# Patient Record
Sex: Female | Born: 1935 | Race: White | Hispanic: No | Marital: Married | Smoking: Never smoker
Health system: Southern US, Community
[De-identification: ages and names within clinical notes are randomized; demographics above are authoritative.]

## PROBLEM LIST (undated history)

## (undated) DIAGNOSIS — B029 Zoster without complications: Secondary | ICD-10-CM

## (undated) DIAGNOSIS — D469 Myelodysplastic syndrome, unspecified: Secondary | ICD-10-CM

## (undated) HISTORY — DX: Zoster without complications: B02.9

## (undated) HISTORY — DX: Myelodysplastic syndrome, unspecified: D46.9

---

## 1999-08-24 ENCOUNTER — Encounter: Payer: Self-pay | Admitting: Geriatric Medicine

## 1999-08-24 ENCOUNTER — Encounter: Admission: RE | Admit: 1999-08-24 | Discharge: 1999-08-24 | Payer: Self-pay | Admitting: Geriatric Medicine

## 2000-07-08 ENCOUNTER — Emergency Department (HOSPITAL_COMMUNITY): Admission: EM | Admit: 2000-07-08 | Discharge: 2000-07-08 | Payer: Self-pay | Admitting: Emergency Medicine

## 2000-10-29 ENCOUNTER — Encounter: Payer: Self-pay | Admitting: Geriatric Medicine

## 2000-10-29 ENCOUNTER — Ambulatory Visit (HOSPITAL_COMMUNITY): Admission: RE | Admit: 2000-10-29 | Discharge: 2000-10-29 | Payer: Self-pay | Admitting: Geriatric Medicine

## 2001-11-22 ENCOUNTER — Ambulatory Visit (HOSPITAL_COMMUNITY): Admission: RE | Admit: 2001-11-22 | Discharge: 2001-11-22 | Payer: Self-pay | Admitting: Geriatric Medicine

## 2001-11-22 ENCOUNTER — Encounter: Payer: Self-pay | Admitting: Geriatric Medicine

## 2002-12-26 ENCOUNTER — Encounter: Payer: Self-pay | Admitting: Geriatric Medicine

## 2002-12-26 ENCOUNTER — Ambulatory Visit (HOSPITAL_COMMUNITY): Admission: RE | Admit: 2002-12-26 | Discharge: 2002-12-26 | Payer: Self-pay | Admitting: Geriatric Medicine

## 2004-01-15 ENCOUNTER — Ambulatory Visit (HOSPITAL_COMMUNITY): Admission: RE | Admit: 2004-01-15 | Discharge: 2004-01-15 | Payer: Self-pay | Admitting: Geriatric Medicine

## 2005-02-16 ENCOUNTER — Ambulatory Visit (HOSPITAL_COMMUNITY): Admission: RE | Admit: 2005-02-16 | Discharge: 2005-02-16 | Payer: Self-pay | Admitting: Geriatric Medicine

## 2006-02-19 ENCOUNTER — Ambulatory Visit (HOSPITAL_COMMUNITY): Admission: RE | Admit: 2006-02-19 | Discharge: 2006-02-19 | Payer: Self-pay | Admitting: Geriatric Medicine

## 2007-03-06 ENCOUNTER — Ambulatory Visit (HOSPITAL_COMMUNITY): Admission: RE | Admit: 2007-03-06 | Discharge: 2007-03-06 | Payer: Self-pay | Admitting: Geriatric Medicine

## 2008-03-25 ENCOUNTER — Ambulatory Visit (HOSPITAL_COMMUNITY): Admission: RE | Admit: 2008-03-25 | Discharge: 2008-03-25 | Payer: Self-pay | Admitting: Geriatric Medicine

## 2009-03-31 ENCOUNTER — Ambulatory Visit (HOSPITAL_COMMUNITY): Admission: RE | Admit: 2009-03-31 | Discharge: 2009-03-31 | Payer: Self-pay | Admitting: Geriatric Medicine

## 2010-04-01 ENCOUNTER — Ambulatory Visit (HOSPITAL_COMMUNITY)
Admission: RE | Admit: 2010-04-01 | Discharge: 2010-04-01 | Payer: Self-pay | Source: Home / Self Care | Attending: Geriatric Medicine | Admitting: Geriatric Medicine

## 2010-05-05 ENCOUNTER — Ambulatory Visit: Payer: Self-pay | Admitting: Oncology

## 2010-07-12 ENCOUNTER — Other Ambulatory Visit: Payer: Self-pay | Admitting: Oncology

## 2010-07-12 ENCOUNTER — Encounter (HOSPITAL_BASED_OUTPATIENT_CLINIC_OR_DEPARTMENT_OTHER): Payer: Medicare Other | Admitting: Oncology

## 2010-07-12 DIAGNOSIS — C8299 Follicular lymphoma, unspecified, extranodal and solid organ sites: Secondary | ICD-10-CM

## 2010-07-12 LAB — CBC & DIFF AND RETIC
Basophils Absolute: 0 10*3/uL (ref 0.0–0.1)
EOS%: 0.4 % (ref 0.0–7.0)
HGB: 9.9 g/dL — ABNORMAL LOW (ref 11.6–15.9)
LYMPH%: 41.1 % (ref 14.0–49.7)
MCH: 33.4 pg (ref 25.1–34.0)
NEUT#: 1.2 10*3/uL — ABNORMAL LOW (ref 1.5–6.5)
NEUT%: 44.4 % (ref 38.4–76.8)
Platelets: 17 10*3/uL — ABNORMAL LOW (ref 145–400)
Retic Ct Abs: 17.46 10*3/uL — ABNORMAL LOW (ref 18.30–72.70)
lymph#: 1.1 10*3/uL (ref 0.9–3.3)

## 2010-07-12 LAB — COMPREHENSIVE METABOLIC PANEL
AST: 23 U/L (ref 0–37)
Albumin: 4.1 g/dL (ref 3.5–5.2)
BUN: 25 mg/dL — ABNORMAL HIGH (ref 6–23)
CO2: 23 mEq/L (ref 19–32)
Calcium: 9 mg/dL (ref 8.4–10.5)
Creatinine, Ser: 0.85 mg/dL (ref 0.40–1.20)
Glucose, Bld: 84 mg/dL (ref 70–99)
Potassium: 4.3 mEq/L (ref 3.5–5.3)
Sodium: 140 mEq/L (ref 135–145)
Total Bilirubin: 0.6 mg/dL (ref 0.3–1.2)
Total Protein: 6.7 g/dL (ref 6.0–8.3)

## 2010-07-12 LAB — MORPHOLOGY: PLT EST: DECREASED

## 2010-07-12 LAB — LACTATE DEHYDROGENASE: LDH: 200 U/L (ref 94–250)

## 2010-07-27 ENCOUNTER — Encounter (HOSPITAL_BASED_OUTPATIENT_CLINIC_OR_DEPARTMENT_OTHER): Payer: Medicare Other | Admitting: Oncology

## 2010-07-27 ENCOUNTER — Other Ambulatory Visit: Payer: Self-pay | Admitting: Oncology

## 2010-07-27 ENCOUNTER — Encounter: Payer: Self-pay | Admitting: Oncology

## 2010-07-27 DIAGNOSIS — D469 Myelodysplastic syndrome, unspecified: Secondary | ICD-10-CM

## 2010-07-27 DIAGNOSIS — C8299 Follicular lymphoma, unspecified, extranodal and solid organ sites: Secondary | ICD-10-CM

## 2010-07-27 LAB — COMPREHENSIVE METABOLIC PANEL
ALT: 14 U/L (ref 0–35)
Alkaline Phosphatase: 56 U/L (ref 39–117)
BUN: 20 mg/dL (ref 6–23)
Chloride: 106 mEq/L (ref 96–112)
Potassium: 4.2 mEq/L (ref 3.5–5.3)
Sodium: 139 mEq/L (ref 135–145)
Total Bilirubin: 0.6 mg/dL (ref 0.3–1.2)

## 2010-07-27 LAB — CBC WITH DIFFERENTIAL/PLATELET
BASO%: 0.1 % (ref 0.0–2.0)
LYMPH%: 33.5 % (ref 14.0–49.7)
MCH: 36 pg — ABNORMAL HIGH (ref 25.1–34.0)
MCV: 103.2 fL — ABNORMAL HIGH (ref 79.5–101.0)
NEUT%: 49.4 % (ref 38.4–76.8)
Platelets: 13 10*3/uL — ABNORMAL LOW (ref 145–400)
RBC: 2.18 10*6/uL — ABNORMAL LOW (ref 3.70–5.45)
WBC: 2.1 10*3/uL — ABNORMAL LOW (ref 3.9–10.3)

## 2010-08-10 ENCOUNTER — Other Ambulatory Visit: Payer: Self-pay | Admitting: Oncology

## 2010-08-10 ENCOUNTER — Encounter (HOSPITAL_BASED_OUTPATIENT_CLINIC_OR_DEPARTMENT_OTHER): Payer: Medicare Other | Admitting: Oncology

## 2010-08-10 DIAGNOSIS — C8299 Follicular lymphoma, unspecified, extranodal and solid organ sites: Secondary | ICD-10-CM

## 2010-08-10 DIAGNOSIS — D469 Myelodysplastic syndrome, unspecified: Secondary | ICD-10-CM

## 2010-08-10 LAB — CBC WITH DIFFERENTIAL/PLATELET
EOS%: 1.3 % (ref 0.0–7.0)
LYMPH%: 35.1 % (ref 14.0–49.7)
MCH: 34.9 pg — ABNORMAL HIGH (ref 25.1–34.0)
MCHC: 32.8 g/dL (ref 31.5–36.0)
MONO%: 16.4 % — ABNORMAL HIGH (ref 0.0–14.0)
NEUT#: 1.4 10*3/uL — ABNORMAL LOW (ref 1.5–6.5)
Platelets: 14 10*3/uL — ABNORMAL LOW (ref 145–400)
RBC: 2.18 10*6/uL — ABNORMAL LOW (ref 3.70–5.45)
WBC: 3 10*3/uL — ABNORMAL LOW (ref 3.9–10.3)
nRBC: 0 % (ref 0–0)

## 2010-08-24 ENCOUNTER — Other Ambulatory Visit: Payer: Self-pay | Admitting: Oncology

## 2010-08-24 ENCOUNTER — Encounter (HOSPITAL_COMMUNITY)
Admission: RE | Admit: 2010-08-24 | Discharge: 2010-08-24 | Disposition: A | Discharge status: Home / Self Care | Payer: Medicare Other | Source: Ambulatory Visit | Attending: Oncology | Admitting: Oncology

## 2010-08-24 ENCOUNTER — Encounter (HOSPITAL_BASED_OUTPATIENT_CLINIC_OR_DEPARTMENT_OTHER): Payer: Medicare Other | Admitting: Oncology

## 2010-08-24 DIAGNOSIS — D469 Myelodysplastic syndrome, unspecified: Secondary | ICD-10-CM

## 2010-08-24 DIAGNOSIS — C8299 Follicular lymphoma, unspecified, extranodal and solid organ sites: Secondary | ICD-10-CM

## 2010-08-24 DIAGNOSIS — D649 Anemia, unspecified: Secondary | ICD-10-CM | POA: Insufficient documentation

## 2010-08-24 LAB — CBC WITH DIFFERENTIAL/PLATELET
BASO%: 1.8 % (ref 0.0–2.0)
EOS%: 1.8 % (ref 0.0–7.0)
Eosinophils Absolute: 0 10*3/uL (ref 0.0–0.5)
HGB: 6.5 g/dL — CL (ref 11.6–15.9)
LYMPH%: 46.6 % (ref 14.0–49.7)
MCH: 40.1 pg — ABNORMAL HIGH (ref 25.1–34.0)
MCV: 112.6 fL — ABNORMAL HIGH (ref 79.5–101.0)
NEUT#: 0.6 10*3/uL — ABNORMAL LOW (ref 1.5–6.5)
NEUT%: 29.8 % — ABNORMAL LOW (ref 38.4–76.8)
RDW: 29 % — ABNORMAL HIGH (ref 11.2–14.5)
lymph#: 0.9 10*3/uL (ref 0.9–3.3)

## 2010-08-25 LAB — CROSSMATCH: ABO/RH(D): B POS

## 2010-08-29 ENCOUNTER — Encounter (HOSPITAL_COMMUNITY): Payer: Medicare Other

## 2010-09-02 ENCOUNTER — Encounter (HOSPITAL_COMMUNITY): Payer: Medicare Other

## 2010-09-06 ENCOUNTER — Encounter (HOSPITAL_COMMUNITY): Payer: Medicare Other

## 2010-09-07 ENCOUNTER — Other Ambulatory Visit: Payer: Self-pay | Admitting: Oncology

## 2010-09-07 ENCOUNTER — Encounter (HOSPITAL_BASED_OUTPATIENT_CLINIC_OR_DEPARTMENT_OTHER): Payer: Medicare Other | Admitting: Oncology

## 2010-09-07 DIAGNOSIS — D469 Myelodysplastic syndrome, unspecified: Secondary | ICD-10-CM

## 2010-09-07 LAB — CBC WITH DIFFERENTIAL/PLATELET
EOS%: 1.1 % (ref 0.0–7.0)
Eosinophils Absolute: 0 10*3/uL (ref 0.0–0.5)
NEUT%: 39.2 % (ref 38.4–76.8)
Platelets: 13 10*3/uL — ABNORMAL LOW (ref 145–400)
lymph#: 1.2 10*3/uL (ref 0.9–3.3)
nRBC: 0 % (ref 0–0)

## 2010-09-21 ENCOUNTER — Encounter (HOSPITAL_BASED_OUTPATIENT_CLINIC_OR_DEPARTMENT_OTHER): Payer: Medicare Other | Admitting: Oncology

## 2010-09-21 ENCOUNTER — Other Ambulatory Visit: Payer: Self-pay | Admitting: Oncology

## 2010-09-21 DIAGNOSIS — C8299 Follicular lymphoma, unspecified, extranodal and solid organ sites: Secondary | ICD-10-CM

## 2010-09-21 DIAGNOSIS — D469 Myelodysplastic syndrome, unspecified: Secondary | ICD-10-CM

## 2010-09-21 LAB — CBC WITH DIFFERENTIAL/PLATELET
BASO%: 0 % (ref 0.0–2.0)
Basophils Absolute: 0 10*3/uL (ref 0.0–0.1)
EOS%: 0.3 % (ref 0.0–7.0)
HCT: 24 % — ABNORMAL LOW (ref 34.8–46.6)
HGB: 8 g/dL — ABNORMAL LOW (ref 11.6–15.9)
MCH: 36.2 pg — ABNORMAL HIGH (ref 25.1–34.0)
MCHC: 33.3 g/dL (ref 31.5–36.0)
MCV: 108.6 fL — ABNORMAL HIGH (ref 79.5–101.0)
NEUT#: 2.2 10*3/uL (ref 1.5–6.5)
Platelets: 17 10*3/uL — ABNORMAL LOW (ref 145–400)
WBC: 3.6 10*3/uL — ABNORMAL LOW (ref 3.9–10.3)

## 2010-10-05 ENCOUNTER — Encounter (HOSPITAL_BASED_OUTPATIENT_CLINIC_OR_DEPARTMENT_OTHER): Payer: Medicare Other | Admitting: Oncology

## 2010-10-05 ENCOUNTER — Other Ambulatory Visit: Payer: Self-pay | Admitting: Medical

## 2010-10-05 ENCOUNTER — Encounter (HOSPITAL_COMMUNITY)
Admission: RE | Admit: 2010-10-05 | Discharge: 2010-10-05 | Disposition: A | Discharge status: Home / Self Care | Payer: Medicare Other | Source: Ambulatory Visit | Attending: Oncology | Admitting: Oncology

## 2010-10-05 ENCOUNTER — Other Ambulatory Visit: Payer: Self-pay | Admitting: Oncology

## 2010-10-05 DIAGNOSIS — C8299 Follicular lymphoma, unspecified, extranodal and solid organ sites: Secondary | ICD-10-CM

## 2010-10-05 DIAGNOSIS — D649 Anemia, unspecified: Secondary | ICD-10-CM | POA: Insufficient documentation

## 2010-10-05 DIAGNOSIS — D469 Myelodysplastic syndrome, unspecified: Secondary | ICD-10-CM

## 2010-10-05 DIAGNOSIS — R5381 Other malaise: Secondary | ICD-10-CM

## 2010-10-05 LAB — CBC WITH DIFFERENTIAL/PLATELET
Basophils Absolute: 0 10*3/uL (ref 0.0–0.1)
HCT: 21.8 % — ABNORMAL LOW (ref 34.8–46.6)
HGB: 7.5 g/dL — ABNORMAL LOW (ref 11.6–15.9)
LYMPH%: 36.7 % (ref 14.0–49.7)
MCHC: 34.4 g/dL (ref 31.5–36.0)
MONO#: 0.4 10*3/uL (ref 0.1–0.9)
MONO%: 18.8 % — ABNORMAL HIGH (ref 0.0–14.0)
NEUT%: 43.7 % (ref 38.4–76.8)
RBC: 1.9 10*6/uL — ABNORMAL LOW (ref 3.70–5.45)
RDW: 26.1 % — ABNORMAL HIGH (ref 11.2–14.5)
WBC: 2.2 10*3/uL — ABNORMAL LOW (ref 3.9–10.3)
lymph#: 0.8 10*3/uL — ABNORMAL LOW (ref 0.9–3.3)

## 2010-10-05 LAB — TYPE & CROSSMATCH - CHCC

## 2010-10-07 ENCOUNTER — Encounter: Payer: Medicare Other | Admitting: Oncology

## 2010-10-08 LAB — CROSSMATCH

## 2010-10-19 ENCOUNTER — Other Ambulatory Visit: Payer: Self-pay | Admitting: Oncology

## 2010-10-19 ENCOUNTER — Encounter (HOSPITAL_BASED_OUTPATIENT_CLINIC_OR_DEPARTMENT_OTHER): Payer: Medicare Other | Admitting: Oncology

## 2010-10-19 DIAGNOSIS — C8299 Follicular lymphoma, unspecified, extranodal and solid organ sites: Secondary | ICD-10-CM

## 2010-10-19 LAB — CBC WITH DIFFERENTIAL/PLATELET
Basophils Absolute: 0 10*3/uL (ref 0.0–0.1)
Eosinophils Absolute: 0 10*3/uL (ref 0.0–0.5)
HCT: 28.9 % — ABNORMAL LOW (ref 34.8–46.6)
LYMPH%: 40.6 % (ref 14.0–49.7)
MCH: 36.5 pg — ABNORMAL HIGH (ref 25.1–34.0)
MCHC: 34.8 g/dL (ref 31.5–36.0)
MONO#: 0.3 10*3/uL (ref 0.1–0.9)
NEUT%: 42.5 % (ref 38.4–76.8)
RBC: 2.75 10*6/uL — ABNORMAL LOW (ref 3.70–5.45)
RDW: 28.5 % — ABNORMAL HIGH (ref 11.2–14.5)
WBC: 2.1 10*3/uL — ABNORMAL LOW (ref 3.9–10.3)

## 2010-11-02 ENCOUNTER — Encounter (HOSPITAL_BASED_OUTPATIENT_CLINIC_OR_DEPARTMENT_OTHER): Payer: Medicare Other | Admitting: Oncology

## 2010-11-02 ENCOUNTER — Other Ambulatory Visit: Payer: Self-pay | Admitting: Oncology

## 2010-11-02 DIAGNOSIS — D469 Myelodysplastic syndrome, unspecified: Secondary | ICD-10-CM

## 2010-11-02 DIAGNOSIS — C8299 Follicular lymphoma, unspecified, extranodal and solid organ sites: Secondary | ICD-10-CM

## 2010-11-02 LAB — CBC WITH DIFFERENTIAL/PLATELET
BASO%: 0.5 % (ref 0.0–2.0)
Basophils Absolute: 0 10*3/uL (ref 0.0–0.1)
Eosinophils Absolute: 0 10*3/uL (ref 0.0–0.5)
HGB: 8.7 g/dL — ABNORMAL LOW (ref 11.6–15.9)
MCH: 37.3 pg — ABNORMAL HIGH (ref 25.1–34.0)
MONO%: 17.1 % — ABNORMAL HIGH (ref 0.0–14.0)
Platelets: 15 10*3/uL — ABNORMAL LOW (ref 145–400)
WBC: 2.1 10*3/uL — ABNORMAL LOW (ref 3.9–10.3)
lymph#: 0.9 10*3/uL (ref 0.9–3.3)

## 2010-11-17 ENCOUNTER — Encounter (HOSPITAL_BASED_OUTPATIENT_CLINIC_OR_DEPARTMENT_OTHER): Payer: Medicare Other | Admitting: Oncology

## 2010-11-17 ENCOUNTER — Encounter (HOSPITAL_COMMUNITY)
Admission: RE | Admit: 2010-11-17 | Discharge: 2010-11-17 | Disposition: A | Discharge status: Home / Self Care | Payer: Medicare Other | Source: Ambulatory Visit | Attending: Oncology | Admitting: Oncology

## 2010-11-17 ENCOUNTER — Other Ambulatory Visit: Payer: Self-pay | Admitting: Oncology

## 2010-11-17 DIAGNOSIS — C8299 Follicular lymphoma, unspecified, extranodal and solid organ sites: Secondary | ICD-10-CM

## 2010-11-17 DIAGNOSIS — D649 Anemia, unspecified: Secondary | ICD-10-CM

## 2010-11-17 DIAGNOSIS — D469 Myelodysplastic syndrome, unspecified: Secondary | ICD-10-CM

## 2010-11-17 LAB — CBC WITH DIFFERENTIAL/PLATELET
BASO%: 0.3 % (ref 0.0–2.0)
Eosinophils Absolute: 0 10*3/uL (ref 0.0–0.5)
LYMPH%: 31.6 % (ref 14.0–49.7)
MCH: 38.7 pg — ABNORMAL HIGH (ref 25.1–34.0)
MCHC: 34.8 g/dL (ref 31.5–36.0)
MCV: 111.3 fL — ABNORMAL HIGH (ref 79.5–101.0)
MONO%: 18 % — ABNORMAL HIGH (ref 0.0–14.0)
NEUT#: 1.2 10*3/uL — ABNORMAL LOW (ref 1.5–6.5)
Platelets: 13 10*3/uL — ABNORMAL LOW (ref 145–400)

## 2010-11-18 LAB — CROSSMATCH
ABO/RH(D): B POS
Antibody Screen: NEGATIVE
Unit division: 0

## 2010-11-30 ENCOUNTER — Encounter (HOSPITAL_BASED_OUTPATIENT_CLINIC_OR_DEPARTMENT_OTHER): Payer: Medicare Other | Admitting: Oncology

## 2010-11-30 ENCOUNTER — Other Ambulatory Visit: Payer: Self-pay | Admitting: Oncology

## 2010-11-30 DIAGNOSIS — C8299 Follicular lymphoma, unspecified, extranodal and solid organ sites: Secondary | ICD-10-CM

## 2010-11-30 LAB — CBC WITH DIFFERENTIAL/PLATELET
BASO%: 0.4 % (ref 0.0–2.0)
EOS%: 0.4 % (ref 0.0–7.0)
Eosinophils Absolute: 0 10*3/uL (ref 0.0–0.5)
LYMPH%: 48.3 % (ref 14.0–49.7)
Platelets: 18 10*3/uL — ABNORMAL LOW (ref 145–400)
RDW: 23.4 % — ABNORMAL HIGH (ref 11.2–14.5)
lymph#: 1.1 10*3/uL (ref 0.9–3.3)

## 2010-12-14 ENCOUNTER — Other Ambulatory Visit: Payer: Self-pay | Admitting: Oncology

## 2010-12-14 ENCOUNTER — Encounter (HOSPITAL_BASED_OUTPATIENT_CLINIC_OR_DEPARTMENT_OTHER): Payer: Medicare Other | Admitting: Oncology

## 2010-12-14 DIAGNOSIS — C8299 Follicular lymphoma, unspecified, extranodal and solid organ sites: Secondary | ICD-10-CM

## 2010-12-14 DIAGNOSIS — D469 Myelodysplastic syndrome, unspecified: Secondary | ICD-10-CM

## 2010-12-14 DIAGNOSIS — D649 Anemia, unspecified: Secondary | ICD-10-CM

## 2010-12-14 LAB — CBC WITH DIFFERENTIAL/PLATELET
Basophils Absolute: 0 10*3/uL (ref 0.0–0.1)
HCT: 26.6 % — ABNORMAL LOW (ref 34.8–46.6)
MCH: 35.7 pg — ABNORMAL HIGH (ref 25.1–34.0)
MCHC: 33.8 g/dL (ref 31.5–36.0)
MCV: 105.6 fL — ABNORMAL HIGH (ref 79.5–101.0)
NEUT#: 0.7 10*3/uL — ABNORMAL LOW (ref 1.5–6.5)
Platelets: 21 10*3/uL — ABNORMAL LOW (ref 145–400)
RDW: 24.5 % — ABNORMAL HIGH (ref 11.2–14.5)
WBC: 2.1 10*3/uL — ABNORMAL LOW (ref 3.9–10.3)
lymph#: 0.9 10*3/uL (ref 0.9–3.3)

## 2010-12-28 ENCOUNTER — Other Ambulatory Visit: Payer: Self-pay | Admitting: Oncology

## 2010-12-28 ENCOUNTER — Encounter (HOSPITAL_BASED_OUTPATIENT_CLINIC_OR_DEPARTMENT_OTHER): Payer: Medicare Other | Admitting: Oncology

## 2010-12-28 DIAGNOSIS — C8299 Follicular lymphoma, unspecified, extranodal and solid organ sites: Secondary | ICD-10-CM

## 2010-12-28 DIAGNOSIS — R5383 Other fatigue: Secondary | ICD-10-CM

## 2010-12-28 DIAGNOSIS — D469 Myelodysplastic syndrome, unspecified: Secondary | ICD-10-CM

## 2010-12-28 DIAGNOSIS — D649 Anemia, unspecified: Secondary | ICD-10-CM

## 2010-12-28 LAB — CBC WITH DIFFERENTIAL/PLATELET
BASO%: 0.2 % (ref 0.0–2.0)
EOS%: 0.4 % (ref 0.0–7.0)
MCH: 39 pg — ABNORMAL HIGH (ref 25.1–34.0)
MCHC: 35.1 g/dL (ref 31.5–36.0)
RBC: 2.19 10*6/uL — ABNORMAL LOW (ref 3.70–5.45)
RDW: 28.8 % — ABNORMAL HIGH (ref 11.2–14.5)
WBC: 2.9 10*3/uL — ABNORMAL LOW (ref 3.9–10.3)
lymph#: 0.9 10*3/uL (ref 0.9–3.3)

## 2011-01-11 ENCOUNTER — Encounter (HOSPITAL_BASED_OUTPATIENT_CLINIC_OR_DEPARTMENT_OTHER): Payer: Medicare Other | Admitting: Oncology

## 2011-01-11 ENCOUNTER — Encounter (HOSPITAL_COMMUNITY)
Admission: RE | Admit: 2011-01-11 | Discharge: 2011-01-11 | Disposition: A | Discharge status: Home / Self Care | Payer: Medicare Other | Source: Ambulatory Visit | Attending: Oncology | Admitting: Oncology

## 2011-01-11 ENCOUNTER — Other Ambulatory Visit: Payer: Self-pay | Admitting: Medical

## 2011-01-11 ENCOUNTER — Other Ambulatory Visit: Payer: Self-pay | Admitting: Oncology

## 2011-01-11 DIAGNOSIS — D469 Myelodysplastic syndrome, unspecified: Secondary | ICD-10-CM

## 2011-01-11 DIAGNOSIS — C8299 Follicular lymphoma, unspecified, extranodal and solid organ sites: Secondary | ICD-10-CM

## 2011-01-11 DIAGNOSIS — D649 Anemia, unspecified: Secondary | ICD-10-CM

## 2011-01-11 LAB — CBC WITH DIFFERENTIAL/PLATELET
Basophils Absolute: 0 10*3/uL (ref 0.0–0.1)
EOS%: 0.6 % (ref 0.0–7.0)
Eosinophils Absolute: 0 10*3/uL (ref 0.0–0.5)
HGB: 7.6 g/dL — ABNORMAL LOW (ref 11.6–15.9)
MCH: 40.6 pg — ABNORMAL HIGH (ref 25.1–34.0)
MONO#: 0.5 10*3/uL (ref 0.1–0.9)
NEUT#: 1.6 10*3/uL (ref 1.5–6.5)
RDW: 28.9 % — ABNORMAL HIGH (ref 11.2–14.5)
WBC: 2.8 10*3/uL — ABNORMAL LOW (ref 3.9–10.3)
lymph#: 0.7 10*3/uL — ABNORMAL LOW (ref 0.9–3.3)

## 2011-01-13 ENCOUNTER — Encounter (HOSPITAL_BASED_OUTPATIENT_CLINIC_OR_DEPARTMENT_OTHER): Payer: Medicare Other | Admitting: Oncology

## 2011-01-13 DIAGNOSIS — D469 Myelodysplastic syndrome, unspecified: Secondary | ICD-10-CM

## 2011-01-13 DIAGNOSIS — D649 Anemia, unspecified: Secondary | ICD-10-CM

## 2011-01-14 LAB — CROSSMATCH
Antibody Screen: NEGATIVE
Unit division: 0

## 2011-01-25 ENCOUNTER — Other Ambulatory Visit: Payer: Self-pay | Admitting: Oncology

## 2011-01-25 ENCOUNTER — Encounter (HOSPITAL_BASED_OUTPATIENT_CLINIC_OR_DEPARTMENT_OTHER): Payer: Medicare Other | Admitting: Oncology

## 2011-01-25 DIAGNOSIS — C8299 Follicular lymphoma, unspecified, extranodal and solid organ sites: Secondary | ICD-10-CM

## 2011-01-25 LAB — CBC WITH DIFFERENTIAL/PLATELET
BASO%: 0.4 % (ref 0.0–2.0)
EOS%: 1.2 % (ref 0.0–7.0)
Eosinophils Absolute: 0 10*3/uL (ref 0.0–0.5)
MCHC: 33.4 g/dL (ref 31.5–36.0)
MCV: 106.8 fL — ABNORMAL HIGH (ref 79.5–101.0)
MONO%: 13.9 % (ref 0.0–14.0)
Platelets: 23 10*3/uL — ABNORMAL LOW (ref 145–400)
RBC: 2.94 10*6/uL — ABNORMAL LOW (ref 3.70–5.45)
WBC: 2.5 10*3/uL — ABNORMAL LOW (ref 3.9–10.3)
nRBC: 0 % (ref 0–0)

## 2011-02-01 ENCOUNTER — Encounter: Payer: Self-pay | Admitting: *Deleted

## 2011-02-08 ENCOUNTER — Encounter (HOSPITAL_BASED_OUTPATIENT_CLINIC_OR_DEPARTMENT_OTHER): Payer: Medicare Other | Admitting: Oncology

## 2011-02-08 ENCOUNTER — Other Ambulatory Visit: Payer: Self-pay | Admitting: Medical

## 2011-02-08 DIAGNOSIS — D469 Myelodysplastic syndrome, unspecified: Secondary | ICD-10-CM

## 2011-02-08 DIAGNOSIS — D649 Anemia, unspecified: Secondary | ICD-10-CM

## 2011-02-08 DIAGNOSIS — C8299 Follicular lymphoma, unspecified, extranodal and solid organ sites: Secondary | ICD-10-CM

## 2011-02-08 LAB — CBC WITH DIFFERENTIAL/PLATELET
BASO%: 0.4 % (ref 0.0–2.0)
Basophils Absolute: 0 10*3/uL (ref 0.0–0.1)
EOS%: 0.8 % (ref 0.0–7.0)
HGB: 9.7 g/dL — ABNORMAL LOW (ref 11.6–15.9)
MCH: 38.6 pg — ABNORMAL HIGH (ref 25.1–34.0)
MCHC: 34.7 g/dL (ref 31.5–36.0)
MCV: 111.1 fL — ABNORMAL HIGH (ref 79.5–101.0)
MONO%: 18.8 % — ABNORMAL HIGH (ref 0.0–14.0)
RBC: 2.51 10*6/uL — ABNORMAL LOW (ref 3.70–5.45)
RDW: 27.2 % — ABNORMAL HIGH (ref 11.2–14.5)
lymph#: 0.8 10*3/uL — ABNORMAL LOW (ref 0.9–3.3)

## 2011-02-22 ENCOUNTER — Other Ambulatory Visit: Payer: Self-pay | Admitting: Medical

## 2011-02-22 ENCOUNTER — Encounter (HOSPITAL_BASED_OUTPATIENT_CLINIC_OR_DEPARTMENT_OTHER): Payer: Medicare Other | Admitting: Oncology

## 2011-02-22 DIAGNOSIS — C8299 Follicular lymphoma, unspecified, extranodal and solid organ sites: Secondary | ICD-10-CM

## 2011-02-22 DIAGNOSIS — D649 Anemia, unspecified: Secondary | ICD-10-CM

## 2011-02-22 DIAGNOSIS — D469 Myelodysplastic syndrome, unspecified: Secondary | ICD-10-CM

## 2011-02-22 LAB — CBC WITH DIFFERENTIAL/PLATELET
Basophils Absolute: 0 10*3/uL (ref 0.0–0.1)
EOS%: 0.9 % (ref 0.0–7.0)
Eosinophils Absolute: 0 10*3/uL (ref 0.0–0.5)
HGB: 8.9 g/dL — ABNORMAL LOW (ref 11.6–15.9)
MCV: 114.8 fL — ABNORMAL HIGH (ref 79.5–101.0)
MONO#: 0.5 10*3/uL (ref 0.1–0.9)
NEUT#: 1.1 10*3/uL — ABNORMAL LOW (ref 1.5–6.5)
NEUT%: 47.8 % (ref 38.4–76.8)
RBC: 2.36 10*6/uL — ABNORMAL LOW (ref 3.70–5.45)
WBC: 2.3 10*3/uL — ABNORMAL LOW (ref 3.9–10.3)
lymph#: 0.7 10*3/uL — ABNORMAL LOW (ref 0.9–3.3)

## 2011-03-01 ENCOUNTER — Encounter: Payer: Self-pay | Admitting: *Deleted

## 2011-03-09 ENCOUNTER — Other Ambulatory Visit (HOSPITAL_BASED_OUTPATIENT_CLINIC_OR_DEPARTMENT_OTHER): Payer: Medicare Other

## 2011-03-09 ENCOUNTER — Telehealth: Payer: Self-pay | Admitting: Oncology

## 2011-03-09 ENCOUNTER — Other Ambulatory Visit: Payer: Self-pay | Admitting: Medical

## 2011-03-09 ENCOUNTER — Ambulatory Visit (HOSPITAL_BASED_OUTPATIENT_CLINIC_OR_DEPARTMENT_OTHER): Payer: Medicare Other | Admitting: Oncology

## 2011-03-09 VITALS — BP 127/50 | HR 86 | Temp 96.2°F | Ht 59.0 in | Wt 98.5 lb

## 2011-03-09 DIAGNOSIS — D649 Anemia, unspecified: Secondary | ICD-10-CM

## 2011-03-09 DIAGNOSIS — D469 Myelodysplastic syndrome, unspecified: Secondary | ICD-10-CM

## 2011-03-09 LAB — CBC WITH DIFFERENTIAL/PLATELET
Basophils Absolute: 0 10*3/uL (ref 0.0–0.1)
Eosinophils Absolute: 0 10*3/uL (ref 0.0–0.5)
HGB: 8.7 g/dL — ABNORMAL LOW (ref 11.6–15.9)
LYMPH%: 27.9 % (ref 14.0–49.7)
MCV: 119.7 fL — ABNORMAL HIGH (ref 79.5–101.0)
MONO#: 0.4 10*3/uL (ref 0.1–0.9)
NEUT#: 1.2 10*3/uL — ABNORMAL LOW (ref 1.5–6.5)
Platelets: 19 10*3/uL — ABNORMAL LOW (ref 145–400)
RBC: 2.1 10*6/uL — ABNORMAL LOW (ref 3.70–5.45)
WBC: 2.2 10*3/uL — ABNORMAL LOW (ref 3.9–10.3)

## 2011-03-09 MED ORDER — DARBEPOETIN ALFA-POLYSORBATE 500 MCG/ML IJ SOLN
200.0000 ug | Freq: Once | INTRAMUSCULAR | Status: AC
  Administered 2011-03-09: 200 ug via SUBCUTANEOUS
  Filled 2011-03-09: qty 1

## 2011-03-09 NOTE — Progress Notes (Signed)
Hematology and Oncology Follow Up Visit  Misty Dunn [id number] 1922/03/15 77 y.o. 03/09/2011 10:27 AM  CC: Hal T. Stoneking, M.D.    Principle Diagnosis:This is an 89-year female with myelodysplastic syndrome.  She also has an element of B-cell lymphoproliferative disorder diagnosed in the early part of 2012.    Prior Therapy:The patient received a course rituximab in March 2012 given in [place] with really minimal response.   Current therapy::  She is on Aranesp 200 mcg subcu every 2 weeks to keep her hemoglobin above 10, also was supportive transfusions.  Interim History:  Ms. Misty Dunn presents today for an office followup visit.  Her family member accompanies her.  This is a pleasant 77 year old female with a history of myelodysplastic syndrome with pancytopenia related to that.  She has been receiving supportive transfusions with red blood cells and platelets as needed.  She is also receiving Aranesp 200 mcg subcu every 2 weeks.  Overall.  Ms. Misty Dunn reports that she feels excellent.  She has a good energy level and performance status.  She is not reporting any recent illnesses, any new medications or recent hospitalizations.  She is able to get out and about and perform her activities of living independently without any hindrance or decline.  She has not reported any headaches, visual changes, dysphagia, odynophagia, any nausea, vomiting, diarrhea, constipation, chest pain, shortness of breath, productive cough, abdominal pain, abdominal swelling, lower extremity paresthesias, bowel or bladder incontinence, any fevers, chills or night sweats, any palpable lymph node swelling.  She has not reported any obvious bleeding such as epistaxis, gum bleeding, melena, hematochezia, hematuria or any type of hemoptysis.  Overall she reports she has a good appetite.  She is eating and drinking quite well.   Medications: I have reviewed the patient's current medications. Current outpatient  prescriptions:brinzolamide (AZOPT) 1 % ophthalmic suspension, Place 1 drop into both eyes 2 (two) times daily.  , Disp: , Rfl: ;  Calcium Carbonate-Vit D-Min (CALTRATE PLUS PO), Take 1 tablet by mouth daily.  , Disp: , Rfl: ;  Multiple Vitamin (MULTIVITAMIN) tablet, Take 1 tablet by mouth daily.  , Disp: , Rfl:   Allergies:  Allergies  Allergen Reactions  . Codeine     Past Medical History, Surgical history, Social history, and Family History were reviewed and updated.  Review of Systems: Constitutional:  Negative for fever, chills, night sweats, anorexia, weight loss, pain. Cardiovascular: no chest pain or dyspnea on exertion Respiratory: no cough, shortness of breath, or wheezing Neurological: no TIA or stroke symptoms Dermatological: negative ENT: negative Gastrointestinal: no abdominal pain, change in bowel habits, or black or bloody stools Genito-Urinary: no dysuria, trouble voiding, or hematuria Hematological and Lymphatic: negative Breast: negative for breast lumps Musculoskeletal: negative Remaining ROS negative. Physical Exam: Blood pressure 127/50, pulse 86, temperature 96.2 F (35.7 C), temperature source Oral, height 4\' 11"  (1.499 m), weight 98 lb 8 oz (44.679 kg). ECOG:  General appearance: alert Head: Normocephalic, without obvious abnormality, atraumatic Neck: no adenopathy, no carotid bruit, no JVD, supple, symmetrical, trachea midline and thyroid not enlarged, symmetric, no tenderness/mass/nodules Lymph nodes: Cervical, supraclavicular, and axillary nodes normal. Heart:regular rate and rhythm, S1, S2 normal, no murmur, click, rub or gallop Lung:chest clear, no wheezing, rales, normal symmetric air entry, Heart exam - S1, S2 normal, no murmur, no gallop, rate regular Abdomin: soft, non-tender, without masses or organomegaly EXT:no erythema, induration, or nodules   Lab Results: Lab Results  Component Value Date   WBC 2.2* 03/09/2011  HGB 8.7* 03/09/2011    HCT 25.1* 03/09/2011   MCV 119.7* 03/09/2011   PLT 19* 03/09/2011     Chemistry      Component Value Date/Time   NA 139 07/27/2010 0937   NA 139 07/27/2010 0937   K 4.2 07/27/2010 0937   K 4.2 07/27/2010 0937   CL 106 07/27/2010 0937   CL 106 07/27/2010 0937   CO2 26 07/27/2010 0937   CO2 26 07/27/2010 0937   BUN 20 07/27/2010 0937   BUN 20 07/27/2010 0937   CREATININE 0.88 07/27/2010 0937   CREATININE 0.88 07/27/2010 0937      Component Value Date/Time   CALCIUM 8.7 07/27/2010 0937   CALCIUM 8.7 07/27/2010 0937   ALKPHOS 56 07/27/2010 0937   ALKPHOS 56 07/27/2010 0937   AST 24 07/27/2010 0937   AST 24 07/27/2010 0937   ALT 14 07/27/2010 0937   ALT 14 07/27/2010 0937   BILITOT 0.6 07/27/2010 0937   BILITOT 0.6 07/27/2010 0937          Impression and Plan:  This is an 77 year old female with the following issues:   1. Myelodysplastic syndrome.  We will continue Aranesp 200 mcg subcu every 2 weeks as needed to keep her hemoglobin above 10, along with supportive transfusions of red blood cells and platelets as needed. 2. Anemia.  Again, she does have myelodysplastic syndrome as well as an element of B-cell lymphoproliferative disorder.  We will continue with Aranesp and supportive measures. She will not need PRBC transfusion today. 3. Thrombocytopenia.  She has no active bleeding.  We will continue to monitor this. 4. Neutropenia.  Her absolute neutrophil count is above 1000.  We will continue to monitor this. 5. Followup.  In 2 months.     [facility], MD 11/15/201210:27 AM

## 2011-03-09 NOTE — Telephone Encounter (Signed)
Pt wants all appt on Wednesday, talked to MD, he informed me that all appts be moved to Wednesday per pt rqst

## 2011-03-21 ENCOUNTER — Other Ambulatory Visit: Payer: Self-pay | Admitting: Oncology

## 2011-03-22 ENCOUNTER — Other Ambulatory Visit: Payer: Self-pay | Admitting: Medical

## 2011-03-22 ENCOUNTER — Other Ambulatory Visit (HOSPITAL_BASED_OUTPATIENT_CLINIC_OR_DEPARTMENT_OTHER): Payer: Medicare Other | Admitting: Lab

## 2011-03-22 ENCOUNTER — Ambulatory Visit (HOSPITAL_BASED_OUTPATIENT_CLINIC_OR_DEPARTMENT_OTHER): Payer: Medicare Other

## 2011-03-22 VITALS — BP 124/55 | HR 91 | Temp 98.7°F

## 2011-03-22 DIAGNOSIS — D469 Myelodysplastic syndrome, unspecified: Secondary | ICD-10-CM

## 2011-03-22 DIAGNOSIS — C8299 Follicular lymphoma, unspecified, extranodal and solid organ sites: Secondary | ICD-10-CM

## 2011-03-22 DIAGNOSIS — D649 Anemia, unspecified: Secondary | ICD-10-CM

## 2011-03-22 LAB — CBC WITH DIFFERENTIAL/PLATELET
BASO%: 0.4 % (ref 0.0–2.0)
Eosinophils Absolute: 0 10*3/uL (ref 0.0–0.5)
HCT: 24.2 % — ABNORMAL LOW (ref 34.8–46.6)
LYMPH%: 31.1 % (ref 14.0–49.7)
MCHC: 34.7 g/dL (ref 31.5–36.0)
MCV: 122.6 fL — ABNORMAL HIGH (ref 79.5–101.0)
MONO#: 0.5 10*3/uL (ref 0.1–0.9)
MONO%: 17.8 % — ABNORMAL HIGH (ref 0.0–14.0)
NEUT%: 50.3 % (ref 38.4–76.8)
Platelets: 23 10*3/uL — ABNORMAL LOW (ref 145–400)
RBC: 1.98 10*6/uL — ABNORMAL LOW (ref 3.70–5.45)
WBC: 2.6 10*3/uL — ABNORMAL LOW (ref 3.9–10.3)

## 2011-03-22 MED ORDER — DARBEPOETIN ALFA-POLYSORBATE 500 MCG/ML IJ SOLN
200.0000 ug | Freq: Once | INTRAMUSCULAR | Status: AC
  Administered 2011-03-22: 200 ug via SUBCUTANEOUS
  Filled 2011-03-22: qty 1

## 2011-04-05 ENCOUNTER — Other Ambulatory Visit: Payer: Self-pay | Admitting: Oncology

## 2011-04-05 ENCOUNTER — Other Ambulatory Visit (HOSPITAL_BASED_OUTPATIENT_CLINIC_OR_DEPARTMENT_OTHER): Payer: Medicare Other | Admitting: Lab

## 2011-04-05 ENCOUNTER — Ambulatory Visit (HOSPITAL_BASED_OUTPATIENT_CLINIC_OR_DEPARTMENT_OTHER): Payer: Medicare Other

## 2011-04-05 VITALS — BP 136/55 | HR 86 | Temp 98.2°F

## 2011-04-05 DIAGNOSIS — D649 Anemia, unspecified: Secondary | ICD-10-CM

## 2011-04-05 DIAGNOSIS — D469 Myelodysplastic syndrome, unspecified: Secondary | ICD-10-CM

## 2011-04-05 LAB — CBC WITH DIFFERENTIAL/PLATELET
BASO%: 0.4 % (ref 0.0–2.0)
EOS%: 0.4 % (ref 0.0–7.0)
HCT: 24 % — ABNORMAL LOW (ref 34.8–46.6)
LYMPH%: 31.5 % (ref 14.0–49.7)
MCH: 40.7 pg — ABNORMAL HIGH (ref 25.1–34.0)
MCHC: 32.9 g/dL (ref 31.5–36.0)
NEUT%: 48.8 % (ref 38.4–76.8)
Platelets: 24 10*3/uL — ABNORMAL LOW (ref 145–400)
RBC: 1.94 10*6/uL — ABNORMAL LOW (ref 3.70–5.45)
WBC: 2.5 10*3/uL — ABNORMAL LOW (ref 3.9–10.3)
nRBC: 0 % (ref 0–0)

## 2011-04-05 LAB — HOLD TUBE, BLOOD BANK

## 2011-04-05 MED ORDER — DARBEPOETIN ALFA-POLYSORBATE 500 MCG/ML IJ SOLN
200.0000 ug | Freq: Once | INTRAMUSCULAR | Status: AC
  Administered 2011-04-05: 200 ug via SUBCUTANEOUS
  Filled 2011-04-05: qty 1

## 2011-04-19 ENCOUNTER — Ambulatory Visit: Payer: Medicare Other

## 2011-04-19 ENCOUNTER — Other Ambulatory Visit: Payer: Medicare Other | Admitting: Lab

## 2011-05-03 ENCOUNTER — Other Ambulatory Visit: Payer: Medicare Other

## 2011-05-03 ENCOUNTER — Ambulatory Visit (HOSPITAL_BASED_OUTPATIENT_CLINIC_OR_DEPARTMENT_OTHER): Payer: Medicare Other

## 2011-05-03 VITALS — BP 124/52 | HR 74 | Temp 98.1°F

## 2011-05-03 DIAGNOSIS — D469 Myelodysplastic syndrome, unspecified: Secondary | ICD-10-CM

## 2011-05-03 DIAGNOSIS — D649 Anemia, unspecified: Secondary | ICD-10-CM

## 2011-05-03 LAB — CBC WITH DIFFERENTIAL/PLATELET
Basophils Absolute: 0 10*3/uL (ref 0.0–0.1)
EOS%: 0.5 % (ref 0.0–7.0)
Eosinophils Absolute: 0 10*3/uL (ref 0.0–0.5)
HCT: 22.2 % — ABNORMAL LOW (ref 34.8–46.6)
HGB: 7.3 g/dL — ABNORMAL LOW (ref 11.6–15.9)
MCH: 42 pg — ABNORMAL HIGH (ref 25.1–34.0)
MCV: 127.6 fL — ABNORMAL HIGH (ref 79.5–101.0)
MONO%: 21.1 % — ABNORMAL HIGH (ref 0.0–14.0)
NEUT#: 1.9 10*3/uL (ref 1.5–6.5)
NEUT%: 51.3 % (ref 38.4–76.8)
Platelets: 30 10*3/uL — ABNORMAL LOW (ref 145–400)

## 2011-05-03 MED ORDER — DARBEPOETIN ALFA-POLYSORBATE 200 MCG/0.4ML IJ SOLN
200.0000 ug | Freq: Once | INTRAMUSCULAR | Status: AC
  Administered 2011-05-03: 200 ug via SUBCUTANEOUS
  Filled 2011-05-03: qty 0.4

## 2011-05-17 ENCOUNTER — Other Ambulatory Visit (HOSPITAL_BASED_OUTPATIENT_CLINIC_OR_DEPARTMENT_OTHER): Payer: Medicare Other | Admitting: Lab

## 2011-05-17 ENCOUNTER — Ambulatory Visit: Payer: Medicare Other

## 2011-05-17 ENCOUNTER — Ambulatory Visit (HOSPITAL_BASED_OUTPATIENT_CLINIC_OR_DEPARTMENT_OTHER): Payer: Medicare Other | Admitting: Oncology

## 2011-05-17 VITALS — BP 140/61 | HR 79 | Temp 96.9°F | Ht 59.0 in | Wt 100.4 lb

## 2011-05-17 DIAGNOSIS — D696 Thrombocytopenia, unspecified: Secondary | ICD-10-CM

## 2011-05-17 DIAGNOSIS — D649 Anemia, unspecified: Secondary | ICD-10-CM

## 2011-05-17 DIAGNOSIS — C8299 Follicular lymphoma, unspecified, extranodal and solid organ sites: Secondary | ICD-10-CM

## 2011-05-17 DIAGNOSIS — D709 Neutropenia, unspecified: Secondary | ICD-10-CM

## 2011-05-17 DIAGNOSIS — D469 Myelodysplastic syndrome, unspecified: Secondary | ICD-10-CM

## 2011-05-17 LAB — CBC WITH DIFFERENTIAL/PLATELET
BASO%: 0.3 % (ref 0.0–2.0)
EOS%: 0.7 % (ref 0.0–7.0)
MCH: 42.4 pg — ABNORMAL HIGH (ref 25.1–34.0)
MCHC: 32.9 g/dL (ref 31.5–36.0)
MCV: 128.8 fL — ABNORMAL HIGH (ref 79.5–101.0)
MONO%: 13.9 % (ref 0.0–14.0)
RBC: 1.7 10*6/uL — ABNORMAL LOW (ref 3.70–5.45)
RDW: 14.6 % — ABNORMAL HIGH (ref 11.2–14.5)
lymph#: 1 10*3/uL (ref 0.9–3.3)

## 2011-05-17 MED ORDER — DARBEPOETIN ALFA-POLYSORBATE 300 MCG/0.6ML IJ SOLN
300.0000 ug | Freq: Once | INTRAMUSCULAR | Status: AC
  Administered 2011-05-17: 300 ug via SUBCUTANEOUS
  Filled 2011-05-17: qty 0.6

## 2011-05-17 NOTE — Progress Notes (Signed)
Hematology and Oncology Follow Up Visit  Misty Dunn [id number] Mar 17, 1922 78 y.o. 05/17/2011 12:21 PM  CC: Hal T. Stoneking, M.D.    Principle Diagnosis:This is an 89-year female with myelodysplastic syndrome.  She also has an element of B-cell lymphoproliferative disorder diagnosed in the early part of 2012.    Prior Therapy:The patient received a course rituximab in March 2012 given in [place] with really minimal response.   Current therapy::  She is on Aranesp 200 mcg subcu every 2 weeks to keep her hemoglobin above 10, also was supportive transfusions.  Interim History:  Misty Dunn presents today for an office followup visit.  Her family member accompanies her.  This is a pleasant 78 year old female with a history of myelodysplastic syndrome with pancytopenia related to that.  She has been receiving supportive transfusions with red blood cells and platelets as needed.  She is also receiving Aranesp 200 mcg subcu every 2 weeks.  Overall.  Misty Dunn reports that she feels excellent.  She has a good energy level and performance status.  She is not reporting any recent illnesses, any new medications or recent hospitalizations.  She is able to get out and about and perform her activities of living independently without any hindrance or decline.  She has not reported any headaches, visual changes, dysphagia, odynophagia, any nausea, vomiting, diarrhea, constipation, chest pain, shortness of breath, productive cough, abdominal pain, abdominal swelling, lower extremity paresthesias, bowel or bladder incontinence, any fevers, chills or night sweats, any palpable lymph node swelling.  She has not reported any obvious bleeding such as epistaxis, gum bleeding, melena, hematochezia, hematuria or any type of hemoptysis.  Overall she reports she has a good appetite.  She is eating and drinking quite well.  Her husband has been placed in a nursing home in [place] which have decreased the stress of  caring for him.   Medications: I have reviewed the patient's current medications. Current outpatient prescriptions:brinzolamide (AZOPT) 1 % ophthalmic suspension, Place 1 drop into both eyes 2 (two) times daily.  , Disp: , Rfl: ;  Calcium Carbonate-Vit D-Min (CALTRATE PLUS PO), Take 1 tablet by mouth daily.  , Disp: , Rfl: ;  Multiple Vitamin (MULTIVITAMIN) tablet, Take 1 tablet by mouth daily.  , Disp: , Rfl:  Current facility-administered medications:darbepoetin (ARANESP) injection 300 mcg, 300 mcg, Subcutaneous, Once, Eli Hose, MD  Allergies:  Allergies  Allergen Reactions  . Codeine     Past Medical History, Surgical history, Social history, and Family History were reviewed and updated.  Review of Systems: Constitutional:  Negative for fever, chills, night sweats, anorexia, weight loss, pain. Cardiovascular: no chest pain or dyspnea on exertion Respiratory: no cough, shortness of breath, or wheezing Neurological: no TIA or stroke symptoms Dermatological: negative ENT: negative Gastrointestinal: no abdominal pain, change in bowel habits, or black or bloody stools Genito-Urinary: no dysuria, trouble voiding, or hematuria Hematological and Lymphatic: negative Breast: negative for breast lumps Musculoskeletal: negative Remaining ROS negative. Physical Exam: Blood pressure 140/61, pulse 79, temperature 96.9 F (36.1 C), temperature source Oral, height 4\' 11"  (1.499 m), weight 100 lb 6.4 oz (45.541 kg). ECOG: 1 General appearance: alert Head: Normocephalic, without obvious abnormality, atraumatic Neck: no adenopathy, no carotid bruit, no JVD, supple, symmetrical, trachea midline and thyroid not enlarged, symmetric, no tenderness/mass/nodules Lymph nodes: Cervical, supraclavicular, and axillary nodes normal. Heart:regular rate and rhythm, S1, S2 normal, no murmur, click, rub or gallop Lung:chest clear, no wheezing, rales, normal symmetric air entry, Heart exam - S1,  S2 normal, no  murmur, no gallop, rate regular Abdomin: soft, non-tender, without masses or organomegaly EXT:no erythema, induration, or nodules   Lab Results: Lab Results  Component Value Date   WBC 3.0* 05/17/2011   HGB 7.2* 05/17/2011   HCT 21.9* 05/17/2011   MCV 128.8* 05/17/2011   PLT 31* 05/17/2011     Chemistry      Component Value Date/Time   NA 139 07/27/2010 0937   NA 139 07/27/2010 0937   K 4.2 07/27/2010 0937   K 4.2 07/27/2010 0937   CL 106 07/27/2010 0937   CL 106 07/27/2010 0937   CO2 26 07/27/2010 0937   CO2 26 07/27/2010 0937   BUN 20 07/27/2010 0937   BUN 20 07/27/2010 0937   CREATININE 0.88 07/27/2010 0937   CREATININE 0.88 07/27/2010 0937      Component Value Date/Time   CALCIUM 8.7 07/27/2010 0937   CALCIUM 8.7 07/27/2010 0937   ALKPHOS 56 07/27/2010 0937   ALKPHOS 56 07/27/2010 0937   AST 24 07/27/2010 0937   AST 24 07/27/2010 0937   ALT 14 07/27/2010 0937   ALT 14 07/27/2010 0937   BILITOT 0.6 07/27/2010 0937   BILITOT 0.6 07/27/2010 0937          Impression and Plan:  This is an 78 year old female with the following issues:   1. Myelodysplastic syndrome.  We will change Aranesp to 300 mcg subcu every 3 weeks as needed to keep her hemoglobin above 10, along with supportive transfusions of red blood cells and platelets as needed. 2. Anemia.  Again, she does have myelodysplastic syndrome as well as an element of B-cell lymphoproliferative disorder.  We will continue with Aranesp and supportive measures. She will not need PRBC transfusion today. 3. Thrombocytopenia.  She has no active bleeding.  We will continue to monitor this. 4. Neutropenia.  Her absolute neutrophil count is above 1000.  We will continue to monitor this. 5. Followup.  In 07/19/2011     [facility], MD 1/23/201312:21 PM

## 2011-05-18 ENCOUNTER — Ambulatory Visit: Payer: Medicare Other | Admitting: Oncology

## 2011-05-24 ENCOUNTER — Other Ambulatory Visit (HOSPITAL_COMMUNITY): Payer: Self-pay | Admitting: Geriatric Medicine

## 2011-05-24 DIAGNOSIS — Z1231 Encounter for screening mammogram for malignant neoplasm of breast: Secondary | ICD-10-CM

## 2011-06-07 ENCOUNTER — Other Ambulatory Visit: Payer: Medicare Other | Admitting: Lab

## 2011-06-07 ENCOUNTER — Ambulatory Visit (HOSPITAL_BASED_OUTPATIENT_CLINIC_OR_DEPARTMENT_OTHER): Payer: Medicare Other

## 2011-06-07 VITALS — BP 132/54 | HR 88 | Temp 97.8°F

## 2011-06-07 DIAGNOSIS — D649 Anemia, unspecified: Secondary | ICD-10-CM

## 2011-06-07 LAB — CBC WITH DIFFERENTIAL/PLATELET
Basophils Absolute: 0 10*3/uL (ref 0.0–0.1)
Eosinophils Absolute: 0 10*3/uL (ref 0.0–0.5)
HCT: 23.6 % — ABNORMAL LOW (ref 34.8–46.6)
HGB: 7.8 g/dL — ABNORMAL LOW (ref 11.6–15.9)
LYMPH%: 34.2 % (ref 14.0–49.7)
MCV: 129.7 fL — ABNORMAL HIGH (ref 79.5–101.0)
MONO#: 0.4 10*3/uL (ref 0.1–0.9)
MONO%: 15.6 % — ABNORMAL HIGH (ref 0.0–14.0)
NEUT#: 1.3 10*3/uL — ABNORMAL LOW (ref 1.5–6.5)
Platelets: 33 10*3/uL — ABNORMAL LOW (ref 145–400)
WBC: 2.7 10*3/uL — ABNORMAL LOW (ref 3.9–10.3)
nRBC: 0 % (ref 0–0)

## 2011-06-07 MED ORDER — DARBEPOETIN ALFA-POLYSORBATE 300 MCG/0.6ML IJ SOLN
300.0000 ug | Freq: Once | INTRAMUSCULAR | Status: AC
  Administered 2011-06-07: 300 ug via SUBCUTANEOUS
  Filled 2011-06-07: qty 0.6

## 2011-06-21 ENCOUNTER — Ambulatory Visit (HOSPITAL_COMMUNITY)
Admission: RE | Admit: 2011-06-21 | Discharge: 2011-06-21 | Disposition: A | Discharge status: Home / Self Care | Payer: Medicare Other | Source: Ambulatory Visit | Attending: Geriatric Medicine | Admitting: Geriatric Medicine

## 2011-06-21 DIAGNOSIS — Z1231 Encounter for screening mammogram for malignant neoplasm of breast: Secondary | ICD-10-CM | POA: Insufficient documentation

## 2011-06-28 ENCOUNTER — Ambulatory Visit (HOSPITAL_BASED_OUTPATIENT_CLINIC_OR_DEPARTMENT_OTHER): Payer: Medicare Other

## 2011-06-28 ENCOUNTER — Other Ambulatory Visit (HOSPITAL_BASED_OUTPATIENT_CLINIC_OR_DEPARTMENT_OTHER): Payer: Medicare Other | Admitting: Lab

## 2011-06-28 VITALS — BP 132/54 | HR 71 | Temp 97.7°F

## 2011-06-28 DIAGNOSIS — D649 Anemia, unspecified: Secondary | ICD-10-CM

## 2011-06-28 DIAGNOSIS — D469 Myelodysplastic syndrome, unspecified: Secondary | ICD-10-CM

## 2011-06-28 LAB — CBC WITH DIFFERENTIAL/PLATELET
BASO%: 0.3 % (ref 0.0–2.0)
Basophils Absolute: 0 10*3/uL (ref 0.0–0.1)
EOS%: 1.4 % (ref 0.0–7.0)
HCT: 24.1 % — ABNORMAL LOW (ref 34.8–46.6)
HGB: 7.9 g/dL — ABNORMAL LOW (ref 11.6–15.9)
LYMPH%: 29.7 % (ref 14.0–49.7)
MCH: 42.2 pg — ABNORMAL HIGH (ref 25.1–34.0)
MCHC: 32.8 g/dL (ref 31.5–36.0)
MONO#: 0.5 10*3/uL (ref 0.1–0.9)
NEUT%: 50.8 % (ref 38.4–76.8)
Platelets: 34 10*3/uL — ABNORMAL LOW (ref 145–400)
lymph#: 0.9 10*3/uL (ref 0.9–3.3)

## 2011-06-28 MED ORDER — DARBEPOETIN ALFA-POLYSORBATE 300 MCG/0.6ML IJ SOLN
300.0000 ug | Freq: Once | INTRAMUSCULAR | Status: AC
  Administered 2011-06-28: 300 ug via SUBCUTANEOUS
  Filled 2011-06-28: qty 0.6

## 2011-07-19 ENCOUNTER — Telehealth: Payer: Self-pay | Admitting: Oncology

## 2011-07-19 ENCOUNTER — Other Ambulatory Visit: Payer: Medicare Other

## 2011-07-19 ENCOUNTER — Ambulatory Visit: Payer: Medicare Other

## 2011-07-19 ENCOUNTER — Ambulatory Visit (HOSPITAL_BASED_OUTPATIENT_CLINIC_OR_DEPARTMENT_OTHER): Payer: Medicare Other | Admitting: Oncology

## 2011-07-19 ENCOUNTER — Encounter: Payer: Self-pay | Admitting: Oncology

## 2011-07-19 VITALS — BP 147/66 | HR 77 | Temp 96.7°F | Ht 59.0 in | Wt 99.7 lb

## 2011-07-19 DIAGNOSIS — D6959 Other secondary thrombocytopenia: Secondary | ICD-10-CM

## 2011-07-19 DIAGNOSIS — D649 Anemia, unspecified: Secondary | ICD-10-CM

## 2011-07-19 DIAGNOSIS — D469 Myelodysplastic syndrome, unspecified: Secondary | ICD-10-CM | POA: Insufficient documentation

## 2011-07-19 LAB — CBC WITH DIFFERENTIAL/PLATELET
Basophils Absolute: 0 10*3/uL (ref 0.0–0.1)
Eosinophils Absolute: 0 10*3/uL (ref 0.0–0.5)
HGB: 8.5 g/dL — ABNORMAL LOW (ref 11.6–15.9)
MCV: 128.2 fL — ABNORMAL HIGH (ref 79.5–101.0)
MONO#: 0.5 10*3/uL (ref 0.1–0.9)
NEUT#: 1.4 10*3/uL — ABNORMAL LOW (ref 1.5–6.5)
RBC: 2.02 10*6/uL — ABNORMAL LOW (ref 3.70–5.45)
RDW: 14 % (ref 11.2–14.5)
WBC: 2.8 10*3/uL — ABNORMAL LOW (ref 3.9–10.3)
lymph#: 0.9 10*3/uL (ref 0.9–3.3)
nRBC: 0 % (ref 0–0)

## 2011-07-19 MED ORDER — DARBEPOETIN ALFA-POLYSORBATE 300 MCG/0.6ML IJ SOLN
300.0000 ug | Freq: Once | INTRAMUSCULAR | Status: AC
  Administered 2011-07-19: 300 ug via SUBCUTANEOUS
  Filled 2011-07-19: qty 0.6

## 2011-07-19 NOTE — Progress Notes (Signed)
Hematology and Oncology Follow Up Visit  Misty Dunn [id number] Dec 05, 1921 78 y.o. 07/19/2011 9:38 AM  CC: Misty Dunn, M.D.    Principle Diagnosis:This is an 89-year female with myelodysplastic syndrome.  She also has an element of B-cell lymphoproliferative disorder diagnosed in the early part of 2012.  Prior Therapy:The patient received a course rituximab in March 2012 given in [place] with really minimal response.  Current therapy::  She is on Aranesp 200 mcg subcu every 2 weeks to keep her hemoglobin above 10, also was supportive transfusions.  Interim History:  Misty Dunn presents today for an office followup visit.  Her sister accompanies her.  This is a pleasant 78 year old female with a history of myelodysplastic syndrome with pancytopenia related to that.  She has been receiving supportive transfusions with red blood cells and platelets as needed.  She is also receiving Aranesp 300 mcg subcu every 3 weeks.  Overall.  Misty Dunn reports that she feels excellent.  She has a good energy level and performance status.  She is not reporting any recent illnesses, any new medications or recent hospitalizations.  She is able to get out and about and perform her activities of living independently without any hindrance or decline.  She has not reported any headaches, visual changes, dysphagia, odynophagia, any nausea, vomiting, diarrhea, constipation, chest pain, shortness of breath, productive cough, abdominal pain, abdominal swelling, lower extremity paresthesias, bowel or bladder incontinence, any fevers, chills or night sweats, any palpable lymph node swelling.  She has not reported any obvious bleeding such as epistaxis, gum bleeding, melena, hematochezia, hematuria or any type of hemoptysis.  Overall she reports she has a good appetite.  She is eating and drinking quite well. Her husband is in a nursing home in [place] which has decreased the stress of caring for him.   Medications:  I have reviewed the patient's current medications. Current outpatient prescriptions:brinzolamide (AZOPT) 1 % ophthalmic suspension, Place 1 drop into both eyes 2 (two) times daily.  , Disp: , Rfl: ;  Calcium Carbonate-Vit D-Min (CALTRATE PLUS PO), Take 1 tablet by mouth daily.  , Disp: , Rfl: ;  Multiple Vitamin (MULTIVITAMIN) tablet, Take 1 tablet by mouth daily.  , Disp: , Rfl: ;  alendronate (FOSAMAX) 70 MG tablet, , Disp: , Rfl:  Current facility-administered medications:darbepoetin (ARANESP) injection 300 mcg, 300 mcg, Subcutaneous, Once, Benjiman Core, MD, 300 mcg at 07/19/11 4098  Allergies:  Allergies  Allergen Reactions  . Codeine     Past Medical History, Surgical history, Social history, and Family History were reviewed and updated.  Review of Systems: Constitutional:  Negative for fever, chills, night sweats, anorexia, weight loss, pain. Cardiovascular: no chest pain or dyspnea on exertion Respiratory: no cough, shortness of breath, or wheezing Neurological: no TIA or stroke symptoms Dermatological: negative ENT: negative Gastrointestinal: no abdominal pain, change in bowel habits, or black or bloody stools Genito-Urinary: no dysuria, trouble voiding, or hematuria Hematological and Lymphatic: negative Breast: negative for breast lumps Musculoskeletal: negative Remaining ROS negative. Physical Exam: Blood pressure 147/66, pulse 77, temperature 96.7 F (35.9 C), temperature source Oral, height 4\' 11"  (1.499 m), weight 99 lb 11.2 oz (45.224 kg). ECOG: 1 General appearance: alert Head: Normocephalic, without obvious abnormality, atraumatic Neck: no adenopathy, no carotid bruit, no JVD, supple, symmetrical, trachea midline and thyroid not enlarged, symmetric, no tenderness/mass/nodules Lymph nodes: Cervical, supraclavicular, and axillary nodes normal. Heart:regular rate and rhythm, S1, S2 normal, no murmur, click, rub or gallop Lung:chest clear, no  wheezing, rales, normal  symmetric air entry, Heart exam - S1, S2 normal, no murmur, no gallop, rate regular Abdomin: soft, non-tender, without masses or organomegaly EXT:no erythema, induration, or nodules   Lab Results: Lab Results  Component Value Date   WBC 2.8* 07/19/2011   HGB 8.5* 07/19/2011   HCT 25.9* 07/19/2011   MCV 128.2* 07/19/2011   PLT 35* 07/19/2011     Chemistry      Component Value Date/Time   NA 139 07/27/2010 0937   NA 139 07/27/2010 0937   K 4.2 07/27/2010 0937   K 4.2 07/27/2010 0937   CL 106 07/27/2010 0937   CL 106 07/27/2010 0937   CO2 26 07/27/2010 0937   CO2 26 07/27/2010 0937   BUN 20 07/27/2010 0937   BUN 20 07/27/2010 0937   CREATININE 0.88 07/27/2010 0937   CREATININE 0.88 07/27/2010 0937      Component Value Date/Time   CALCIUM 8.7 07/27/2010 0937   CALCIUM 8.7 07/27/2010 0937   ALKPHOS 56 07/27/2010 0937   ALKPHOS 56 07/27/2010 0937   AST 24 07/27/2010 0937   AST 24 07/27/2010 0937   ALT 14 07/27/2010 0937   ALT 14 07/27/2010 0937   BILITOT 0.6 07/27/2010 0937   BILITOT 0.6 07/27/2010 0937          Impression and Plan:  This is an 78 year old female with the following issues:   1. Myelodysplastic syndrome.  Continue Aranesp 300 mcg subcu every 3 weeks as needed to keep her hemoglobin above 10, along with supportive transfusions of red blood cells and platelets as needed. 2. Anemia.  Again, she does have myelodysplastic syndrome as well as an element of B-cell lymphoproliferative disorder.  We will continue with Aranesp and supportive measures. She will not need PRBC transfusion today. 3. Thrombocytopenia.  She has no active bleeding.  We will continue to monitor this. 4. Neutropenia.  Her absolute neutrophil count is above 1000.  We will continue to monitor this. 5. Follow-up. In 3 months. She will continue to have a CBC and Aranesp injection q 3 weeks.  The patient was seen and examined with Dr Clelia Croft.   [place], [place] 3/27/20139:38 AM

## 2011-07-19 NOTE — Telephone Encounter (Signed)
Gv pt appt for april-june2013

## 2011-08-09 ENCOUNTER — Ambulatory Visit (HOSPITAL_BASED_OUTPATIENT_CLINIC_OR_DEPARTMENT_OTHER): Payer: Medicare Other

## 2011-08-09 ENCOUNTER — Other Ambulatory Visit (HOSPITAL_BASED_OUTPATIENT_CLINIC_OR_DEPARTMENT_OTHER): Payer: Medicare Other | Admitting: Lab

## 2011-08-09 VITALS — BP 128/52 | HR 77 | Temp 97.8°F

## 2011-08-09 DIAGNOSIS — D649 Anemia, unspecified: Secondary | ICD-10-CM

## 2011-08-09 DIAGNOSIS — D469 Myelodysplastic syndrome, unspecified: Secondary | ICD-10-CM

## 2011-08-09 LAB — CBC WITH DIFFERENTIAL/PLATELET
BASO%: 0.5 % (ref 0.0–2.0)
Eosinophils Absolute: 0 10*3/uL (ref 0.0–0.5)
HCT: 27.4 % — ABNORMAL LOW (ref 34.8–46.6)
HGB: 9.2 g/dL — ABNORMAL LOW (ref 11.6–15.9)
LYMPH%: 34.5 % (ref 14.0–49.7)
MCHC: 33.6 g/dL (ref 31.5–36.0)
MONO#: 0.4 10*3/uL (ref 0.1–0.9)
NEUT#: 1.2 10*3/uL — ABNORMAL LOW (ref 1.5–6.5)
NEUT%: 46.8 % (ref 38.4–76.8)
Platelets: 33 10*3/uL — ABNORMAL LOW (ref 145–400)
WBC: 2.6 10*3/uL — ABNORMAL LOW (ref 3.9–10.3)
lymph#: 0.9 10*3/uL (ref 0.9–3.3)

## 2011-08-09 MED ORDER — DARBEPOETIN ALFA-POLYSORBATE 300 MCG/0.6ML IJ SOLN
300.0000 ug | Freq: Once | INTRAMUSCULAR | Status: AC
  Administered 2011-08-09: 300 ug via SUBCUTANEOUS
  Filled 2011-08-09: qty 0.6

## 2011-08-30 ENCOUNTER — Other Ambulatory Visit (HOSPITAL_BASED_OUTPATIENT_CLINIC_OR_DEPARTMENT_OTHER): Payer: Medicare Other | Admitting: Lab

## 2011-08-30 ENCOUNTER — Ambulatory Visit (HOSPITAL_BASED_OUTPATIENT_CLINIC_OR_DEPARTMENT_OTHER): Payer: Medicare Other

## 2011-08-30 VITALS — BP 136/69 | HR 72 | Temp 97.2°F

## 2011-08-30 DIAGNOSIS — D469 Myelodysplastic syndrome, unspecified: Secondary | ICD-10-CM

## 2011-08-30 DIAGNOSIS — D649 Anemia, unspecified: Secondary | ICD-10-CM

## 2011-08-30 LAB — CBC WITH DIFFERENTIAL/PLATELET
Basophils Absolute: 0 10*3/uL (ref 0.0–0.1)
Eosinophils Absolute: 0 10*3/uL (ref 0.0–0.5)
HGB: 9.6 g/dL — ABNORMAL LOW (ref 11.6–15.9)
LYMPH%: 34.1 % (ref 14.0–49.7)
MCH: 41 pg — ABNORMAL HIGH (ref 25.1–34.0)
MCV: 124.8 fL — ABNORMAL HIGH (ref 79.5–101.0)
MONO%: 16.8 % — ABNORMAL HIGH (ref 0.0–14.0)
NEUT#: 1.6 10*3/uL (ref 1.5–6.5)
NEUT%: 47.9 % (ref 38.4–76.8)
Platelets: 42 10*3/uL — ABNORMAL LOW (ref 145–400)

## 2011-08-30 MED ORDER — DARBEPOETIN ALFA-POLYSORBATE 300 MCG/0.6ML IJ SOLN
300.0000 ug | Freq: Once | INTRAMUSCULAR | Status: AC
  Administered 2011-08-30: 300 ug via SUBCUTANEOUS
  Filled 2011-08-30: qty 0.6

## 2011-09-20 ENCOUNTER — Ambulatory Visit: Payer: Medicare Other

## 2011-09-20 ENCOUNTER — Other Ambulatory Visit (HOSPITAL_BASED_OUTPATIENT_CLINIC_OR_DEPARTMENT_OTHER): Payer: Medicare Other | Admitting: Lab

## 2011-09-20 DIAGNOSIS — D649 Anemia, unspecified: Secondary | ICD-10-CM

## 2011-09-20 LAB — CBC WITH DIFFERENTIAL/PLATELET
EOS%: 0.6 % (ref 0.0–7.0)
Eosinophils Absolute: 0 10*3/uL (ref 0.0–0.5)
LYMPH%: 30.6 % (ref 14.0–49.7)
MCH: 42.2 pg — ABNORMAL HIGH (ref 25.1–34.0)
MCHC: 33.4 g/dL (ref 31.5–36.0)
MCV: 126.2 fL — ABNORMAL HIGH (ref 79.5–101.0)
MONO%: 16.1 % — ABNORMAL HIGH (ref 0.0–14.0)
NEUT#: 2 10*3/uL (ref 1.5–6.5)
Platelets: 41 10*3/uL — ABNORMAL LOW (ref 145–400)
RBC: 2.45 10*6/uL — ABNORMAL LOW (ref 3.70–5.45)
nRBC: 0 % (ref 0–0)

## 2011-09-20 MED ORDER — DARBEPOETIN ALFA-POLYSORBATE 300 MCG/0.6ML IJ SOLN
300.0000 ug | Freq: Once | INTRAMUSCULAR | Status: DC
  Filled 2011-09-20: qty 0.6

## 2011-10-11 ENCOUNTER — Ambulatory Visit: Payer: Medicare Other

## 2011-10-11 ENCOUNTER — Other Ambulatory Visit (HOSPITAL_BASED_OUTPATIENT_CLINIC_OR_DEPARTMENT_OTHER): Payer: Medicare Other | Admitting: Lab

## 2011-10-11 ENCOUNTER — Telehealth: Payer: Self-pay | Admitting: Oncology

## 2011-10-11 ENCOUNTER — Ambulatory Visit (HOSPITAL_BASED_OUTPATIENT_CLINIC_OR_DEPARTMENT_OTHER): Payer: Medicare Other | Admitting: Oncology

## 2011-10-11 VITALS — BP 125/65 | HR 74 | Temp 97.8°F | Ht 59.0 in | Wt 101.5 lb

## 2011-10-11 DIAGNOSIS — D469 Myelodysplastic syndrome, unspecified: Secondary | ICD-10-CM

## 2011-10-11 DIAGNOSIS — D649 Anemia, unspecified: Secondary | ICD-10-CM

## 2011-10-11 DIAGNOSIS — D709 Neutropenia, unspecified: Secondary | ICD-10-CM

## 2011-10-11 DIAGNOSIS — D696 Thrombocytopenia, unspecified: Secondary | ICD-10-CM

## 2011-10-11 LAB — CBC WITH DIFFERENTIAL/PLATELET
BASO%: 0.4 % (ref 0.0–2.0)
Eosinophils Absolute: 0 10*3/uL (ref 0.0–0.5)
MCHC: 33.7 g/dL (ref 31.5–36.0)
MONO#: 0.5 10*3/uL (ref 0.1–0.9)
NEUT#: 1.5 10*3/uL (ref 1.5–6.5)
Platelets: 48 10*3/uL — ABNORMAL LOW (ref 145–400)
RBC: 2.51 10*6/uL — ABNORMAL LOW (ref 3.70–5.45)
RDW: 13.3 % (ref 11.2–14.5)
WBC: 2.9 10*3/uL — ABNORMAL LOW (ref 3.9–10.3)
lymph#: 0.8 10*3/uL — ABNORMAL LOW (ref 0.9–3.3)

## 2011-10-11 MED ORDER — DARBEPOETIN ALFA-POLYSORBATE 300 MCG/0.6ML IJ SOLN
300.0000 ug | Freq: Once | INTRAMUSCULAR | Status: DC
  Filled 2011-10-11: qty 0.6

## 2011-10-11 NOTE — Telephone Encounter (Signed)
Gave pt appt for July , August , Sept 2013 lab ,inj and ML

## 2011-10-11 NOTE — Progress Notes (Signed)
Hematology and Oncology Follow Up Visit  Misty Dunn [id number] Dec 17, 1921 78 y.o. 10/11/2011 10:38 AM  CC: Misty Dunn, M.D.    Principle Diagnosis:This is an 89-year female with myelodysplastic syndrome.  She also has an element of B-cell lymphoproliferative disorder diagnosed in the early part of 2012.  Prior Therapy:The patient received a course rituximab in March 2012 given in [place] with really minimal response.  Current therapy::  She is on Aranesp 300 mcg subcu every 3 weeks to keep her hemoglobin above 10, also was supportive transfusions.  Interim History:  Misty Dunn presents today for an office followup visit.  Her sister accompanies her.  This is a pleasant 78 year old female with a history of myelodysplastic syndrome with pancytopenia related to that.  She has been receiving supportive transfusions with red blood cells and platelets as needed.  She is also receiving Aranesp 300 mcg subcu every 3 weeks.  Overall.  Misty Dunn reports that she feels excellent.  She has a good energy level and performance status.  She is not reporting any recent illnesses, any new medications or recent hospitalizations.  She is able to get out and about and perform her activities of living independently without any hindrance or decline.  She has not reported any headaches, visual changes, dysphagia, odynophagia, any nausea, vomiting, diarrhea, constipation, chest pain, shortness of breath, productive cough, abdominal pain, abdominal swelling, lower extremity paresthesias, bowel or bladder incontinence, any fevers, chills or night sweats, any palpable lymph node swelling.  She has not reported any obvious bleeding such as epistaxis, gum bleeding, melena, hematochezia, hematuria or any type of hemoptysis.  Overall she reports she has a good appetite.  She is eating and drinking quite well and have gained one lb since her last visit.    Medications: I have reviewed the patient's current  medications. Current outpatient prescriptions:alendronate (FOSAMAX) 70 MG tablet, , Disp: , Rfl: ;  brinzolamide (AZOPT) 1 % ophthalmic suspension, Place 1 drop into both eyes 2 (two) times daily.  , Disp: , Rfl: ;  Calcium Carbonate-Vit D-Min (CALTRATE PLUS PO), Take 1 tablet by mouth daily.  , Disp: , Rfl: ;  Multiple Vitamin (MULTIVITAMIN) tablet, Take 1 tablet by mouth daily.  , Disp: , Rfl:   Allergies:  Allergies  Allergen Reactions  . Codeine     Past Medical History, Surgical history, Social history, and Family History were reviewed and updated.  Review of Systems: Constitutional:  Negative for fever, chills, night sweats, anorexia, weight loss, pain. Cardiovascular: no chest pain or dyspnea on exertion Respiratory: no cough, shortness of breath, or wheezing Neurological: no TIA or stroke symptoms Dermatological: negative ENT: negative Gastrointestinal: no abdominal pain, change in bowel habits, or black or bloody stools Genito-Urinary: no dysuria, trouble voiding, or hematuria Hematological and Lymphatic: negative Breast: negative for breast lumps Musculoskeletal: negative Remaining ROS negative. Physical Exam: Blood pressure 125/65, pulse 74, temperature 97.8 F (36.6 C), temperature source Oral, height 4\' 11"  (1.499 m), weight 101 lb 8 oz (46.04 kg). ECOG: 1 General appearance: alert Head: Normocephalic, without obvious abnormality, atraumatic Neck: no adenopathy, no carotid bruit, no JVD, supple, symmetrical, trachea midline and thyroid not enlarged, symmetric, no tenderness/mass/nodules Lymph nodes: Cervical, supraclavicular, and axillary nodes normal. Heart:regular rate and rhythm, S1, S2 normal, no murmur, click, rub or gallop Lung:chest clear, no wheezing, rales, normal symmetric air entry, Heart exam - S1, S2 normal, no murmur, no gallop, rate regular Abdomin: soft, non-tender, without masses or organomegaly EXT:no erythema, induration,  or nodules   Lab  Results: Lab Results  Component Value Date   WBC 2.9* 10/11/2011   HGB 10.4* 10/11/2011   HCT 30.9* 10/11/2011   MCV 123.4* 10/11/2011   PLT 48* 10/11/2011     Chemistry      Component Value Date/Time   NA 139 07/27/2010 0937   NA 139 07/27/2010 0937   K 4.2 07/27/2010 0937   K 4.2 07/27/2010 0937   CL 106 07/27/2010 0937   CL 106 07/27/2010 0937   CO2 26 07/27/2010 0937   CO2 26 07/27/2010 0937   BUN 20 07/27/2010 0937   BUN 20 07/27/2010 0937   CREATININE 0.88 07/27/2010 0937   CREATININE 0.88 07/27/2010 0937      Component Value Date/Time   CALCIUM 8.7 07/27/2010 0937   CALCIUM 8.7 07/27/2010 0937   ALKPHOS 56 07/27/2010 0937   ALKPHOS 56 07/27/2010 0937   AST 24 07/27/2010 0937   AST 24 07/27/2010 0937   ALT 14 07/27/2010 0937   ALT 14 07/27/2010 0937   BILITOT 0.6 07/27/2010 0937   BILITOT 0.6 07/27/2010 0937          Impression and Plan:  This is an 78 year old female with the following issues:   1. Myelodysplastic syndrome.  Continue Aranesp 300 mcg subcu every 3 weeks as needed to keep her hemoglobin above 10, along with supportive transfusions of red blood cells and platelets as needed. She will not get Aranesp today. 2. Anemia.  Again, she does have myelodysplastic syndrome as well as an element of B-cell lymphoproliferative disorder.  We will continue with Aranesp and supportive measures. She has not needed PRBC for a while now. 3. Thrombocytopenia.  She has no active bleeding.  We will continue to monitor this. Her platelets are better now.  4. Neutropenia.  Her absolute neutrophil count is above 1000.  We will continue to monitor this. 5. Follow-up. In 3 months. She will continue to have a CBC and Aranesp injection q 3 weeks.   Misty Dunn 6/19/201310:38 AM

## 2011-11-01 ENCOUNTER — Other Ambulatory Visit (HOSPITAL_BASED_OUTPATIENT_CLINIC_OR_DEPARTMENT_OTHER): Payer: Medicare Other | Admitting: Lab

## 2011-11-01 ENCOUNTER — Ambulatory Visit: Payer: Medicare Other

## 2011-11-01 DIAGNOSIS — D649 Anemia, unspecified: Secondary | ICD-10-CM

## 2011-11-01 DIAGNOSIS — D469 Myelodysplastic syndrome, unspecified: Secondary | ICD-10-CM

## 2011-11-01 LAB — CBC WITH DIFFERENTIAL/PLATELET
BASO%: 0.4 % (ref 0.0–2.0)
Eosinophils Absolute: 0.1 10*3/uL (ref 0.0–0.5)
HCT: 30.7 % — ABNORMAL LOW (ref 34.8–46.6)
HGB: 10.4 g/dL — ABNORMAL LOW (ref 11.6–15.9)
LYMPH%: 26 % (ref 14.0–49.7)
MONO#: 0.5 10*3/uL (ref 0.1–0.9)
NEUT#: 1.8 10*3/uL (ref 1.5–6.5)
NEUT%: 56.2 % (ref 38.4–76.8)
Platelets: 51 10*3/uL — ABNORMAL LOW (ref 145–400)
WBC: 3.1 10*3/uL — ABNORMAL LOW (ref 3.9–10.3)
lymph#: 0.8 10*3/uL — ABNORMAL LOW (ref 0.9–3.3)

## 2011-11-01 MED ORDER — DARBEPOETIN ALFA-POLYSORBATE 300 MCG/0.6ML IJ SOLN
300.0000 ug | Freq: Once | INTRAMUSCULAR | Status: DC
  Filled 2011-11-01: qty 0.6

## 2011-11-22 ENCOUNTER — Ambulatory Visit: Payer: Medicare Other

## 2011-11-22 ENCOUNTER — Other Ambulatory Visit (HOSPITAL_BASED_OUTPATIENT_CLINIC_OR_DEPARTMENT_OTHER): Payer: Medicare Other | Admitting: Lab

## 2011-11-22 DIAGNOSIS — D649 Anemia, unspecified: Secondary | ICD-10-CM

## 2011-11-22 DIAGNOSIS — D469 Myelodysplastic syndrome, unspecified: Secondary | ICD-10-CM

## 2011-11-22 LAB — CBC WITH DIFFERENTIAL/PLATELET
BASO%: 0.5 % (ref 0.0–2.0)
Basophils Absolute: 0 10*3/uL (ref 0.0–0.1)
Eosinophils Absolute: 0.1 10*3/uL (ref 0.0–0.5)
HCT: 30 % — ABNORMAL LOW (ref 34.8–46.6)
HGB: 10 g/dL — ABNORMAL LOW (ref 11.6–15.9)
LYMPH%: 28.3 % (ref 14.0–49.7)
MCHC: 33.3 g/dL (ref 31.5–36.0)
MONO#: 0.6 10*3/uL (ref 0.1–0.9)
NEUT%: 50.9 % (ref 38.4–76.8)
Platelets: 51 10*3/uL — ABNORMAL LOW (ref 145–400)
WBC: 3.3 10*3/uL — ABNORMAL LOW (ref 3.9–10.3)
lymph#: 0.9 10*3/uL (ref 0.9–3.3)

## 2011-11-22 MED ORDER — DARBEPOETIN ALFA-POLYSORBATE 300 MCG/0.6ML IJ SOLN
300.0000 ug | Freq: Once | INTRAMUSCULAR | Status: DC
  Filled 2011-11-22: qty 0.6

## 2011-12-13 ENCOUNTER — Other Ambulatory Visit (HOSPITAL_BASED_OUTPATIENT_CLINIC_OR_DEPARTMENT_OTHER): Payer: Medicare Other | Admitting: Lab

## 2011-12-13 ENCOUNTER — Ambulatory Visit: Payer: Medicare Other

## 2011-12-13 DIAGNOSIS — D649 Anemia, unspecified: Secondary | ICD-10-CM

## 2011-12-13 DIAGNOSIS — D469 Myelodysplastic syndrome, unspecified: Secondary | ICD-10-CM

## 2011-12-13 LAB — CBC WITH DIFFERENTIAL/PLATELET
BASO%: 0.5 % (ref 0.0–2.0)
Basophils Absolute: 0 10*3/uL (ref 0.0–0.1)
EOS%: 2 % (ref 0.0–7.0)
HCT: 30.3 % — ABNORMAL LOW (ref 34.8–46.6)
HGB: 10.1 g/dL — ABNORMAL LOW (ref 11.6–15.9)
LYMPH%: 25.8 % (ref 14.0–49.7)
MCH: 39.6 pg — ABNORMAL HIGH (ref 25.1–34.0)
MCHC: 33.3 g/dL (ref 31.5–36.0)
MCV: 119 fL — ABNORMAL HIGH (ref 79.5–101.0)
NEUT%: 53.5 % (ref 38.4–76.8)
Platelets: 50 10*3/uL — ABNORMAL LOW (ref 145–400)
lymph#: 0.8 10*3/uL — ABNORMAL LOW (ref 0.9–3.3)

## 2011-12-13 MED ORDER — DARBEPOETIN ALFA-POLYSORBATE 300 MCG/0.6ML IJ SOLN: 300.0000 ug | Freq: Once | INTRAMUSCULAR | Status: DC

## 2012-01-01 ENCOUNTER — Other Ambulatory Visit: Payer: Self-pay | Admitting: Oncology

## 2012-01-03 ENCOUNTER — Ambulatory Visit: Payer: Medicare Other

## 2012-01-03 ENCOUNTER — Encounter: Payer: Self-pay | Admitting: Oncology

## 2012-01-03 ENCOUNTER — Ambulatory Visit (HOSPITAL_BASED_OUTPATIENT_CLINIC_OR_DEPARTMENT_OTHER): Payer: Medicare Other | Admitting: Oncology

## 2012-01-03 ENCOUNTER — Other Ambulatory Visit (HOSPITAL_BASED_OUTPATIENT_CLINIC_OR_DEPARTMENT_OTHER): Payer: Medicare Other | Admitting: Lab

## 2012-01-03 ENCOUNTER — Telehealth: Payer: Self-pay | Admitting: Oncology

## 2012-01-03 VITALS — BP 129/63 | HR 75 | Temp 98.5°F | Resp 20 | Ht 59.0 in | Wt 104.3 lb

## 2012-01-03 DIAGNOSIS — D469 Myelodysplastic syndrome, unspecified: Secondary | ICD-10-CM

## 2012-01-03 DIAGNOSIS — D649 Anemia, unspecified: Secondary | ICD-10-CM

## 2012-01-03 DIAGNOSIS — D47Z9 Other specified neoplasms of uncertain behavior of lymphoid, hematopoietic and related tissue: Secondary | ICD-10-CM

## 2012-01-03 DIAGNOSIS — D696 Thrombocytopenia, unspecified: Secondary | ICD-10-CM

## 2012-01-03 LAB — CBC WITH DIFFERENTIAL/PLATELET
BASO%: 0.3 % (ref 0.0–2.0)
Basophils Absolute: 0 10*3/uL (ref 0.0–0.1)
EOS%: 1.5 % (ref 0.0–7.0)
HGB: 10.1 g/dL — ABNORMAL LOW (ref 11.6–15.9)
MCH: 38.5 pg — ABNORMAL HIGH (ref 25.1–34.0)
MCHC: 33 g/dL (ref 31.5–36.0)
MCV: 116.8 fL — ABNORMAL HIGH (ref 79.5–101.0)
MONO%: 17.1 % — ABNORMAL HIGH (ref 0.0–14.0)
NEUT%: 54.9 % (ref 38.4–76.8)
RDW: 13.1 % (ref 11.2–14.5)

## 2012-01-03 MED ORDER — DARBEPOETIN ALFA-POLYSORBATE 300 MCG/0.6ML IJ SOLN: 300.0000 ug | INTRAMUSCULAR | Status: DC

## 2012-01-03 NOTE — Telephone Encounter (Signed)
gv pt appt schedule for October 2013 thru January 2014. Lb/inj appts scheduled q4w as per 9/11 pof. Also confirmed w/KC and desk as orders are for q3w.

## 2012-01-03 NOTE — Progress Notes (Signed)
Hematology and Oncology Follow Up Visit  Misty Dunn [id number] 09/27/1921 78 y.o. 01/03/2012 1:06 PM  CC: Misty Dunn, M.D.    Principle Diagnosis:This is an 89-year female with myelodysplastic syndrome.  She also has an element of B-cell lymphoproliferative disorder diagnosed in the early part of 2012.  Prior Therapy:The patient received a course rituximab in March 2012 given in [place] with really minimal response.  Current therapy::  She is on Aranesp 300 mcg subcu every 3 weeks to keep her hemoglobin above 10, also was supportive transfusions.  Interim History:  Misty Dunn presents today for an office followup visit.  Her sister accompanies her.  This is a pleasant 78 year old female with a history of myelodysplastic syndrome with pancytopenia related to that.  Overall.  Misty Dunn reports that she feels excellent.  She has a good energy level and performance status.  She is not reporting any recent illnesses, any new medications or recent hospitalizations.  She is able to get out and about and perform her activities of living independently without any hindrance or decline.  She has not reported any headaches, visual changes, dysphagia, odynophagia, any nausea, vomiting, diarrhea, constipation, chest pain, shortness of breath, productive cough, abdominal pain, abdominal swelling, lower extremity paresthesias, bowel or bladder incontinence, any fevers, chills or night sweats, any palpable lymph node swelling.  She has not reported any obvious bleeding such as epistaxis, gum bleeding, melena, hematochezia, hematuria or any type of hemoptysis.  Overall she reports she has a good appetite.  She is eating and drinking quite well. Weight is stable. The patient has not required an Aranesp injection since May.    Medications: I have reviewed the patient's current medications. Current outpatient prescriptions:alendronate (FOSAMAX) 70 MG tablet, , Disp: , Rfl: ;  brinzolamide (AZOPT) 1 %  ophthalmic suspension, Place 1 drop into both eyes 2 (two) times daily.  , Disp: , Rfl: ;  Calcium Carbonate-Vit D-Min (CALTRATE PLUS PO), Take 1 tablet by mouth daily.  , Disp: , Rfl: ;  Multiple Vitamin (MULTIVITAMIN) tablet, Take 1 tablet by mouth daily.  , Disp: , Rfl:  No current facility-administered medications for this visit. Facility-Administered Medications Ordered in Other Visits: DISCONTD: darbepoetin (ARANESP) injection 300 mcg, 300 mcg, Subcutaneous, Q21 days, Misty Ser, NP  Allergies:  Allergies  Allergen Reactions  . Codeine     Past Medical History, Surgical history, Social history, and Family History were reviewed and updated.  Review of Systems: Constitutional:  Negative for fever, chills, night sweats, anorexia, weight loss, pain. Cardiovascular: no chest pain or dyspnea on exertion Respiratory: no cough, shortness of breath, or wheezing Neurological: no TIA or stroke symptoms Dermatological: negative ENT: negative Gastrointestinal: no abdominal pain, change in bowel habits, or black or bloody stools Genito-Urinary: no dysuria, trouble voiding, or hematuria Hematological and Lymphatic: negative Breast: negative for breast lumps Musculoskeletal: negative Remaining ROS negative. Physical Exam: Blood pressure 129/63, pulse 75, temperature 98.5 F (36.9 C), temperature source Oral, resp. rate 20, height 4\' 11"  (1.499 m), weight 104 lb 4.8 oz (47.31 kg). ECOG: 1 General appearance: alert Head: Normocephalic, without obvious abnormality, atraumatic Neck: no adenopathy, no carotid bruit, no JVD, supple, symmetrical, trachea midline and thyroid not enlarged, symmetric, no tenderness/mass/nodules Lymph nodes: Cervical, supraclavicular, and axillary nodes normal. Heart:regular rate and rhythm, S1, S2 normal, no murmur, click, rub or gallop Lung:chest clear, no wheezing, rales, normal symmetric air entry, Heart exam - S1, S2 normal, no murmur, no gallop, rate  regular Abdomen:  soft, non-tender, without masses or organomegaly EXT:no erythema, induration, or nodules   Lab Results: Lab Results  Component Value Date   WBC 3.3* 01/03/2012   HGB 10.1* 01/03/2012   HCT 30.6* 01/03/2012   MCV 116.8* 01/03/2012   PLT 55* 01/03/2012     Chemistry      Component Value Date/Time   NA 139 07/27/2010 0937   NA 139 07/27/2010 0937   K 4.2 07/27/2010 0937   K 4.2 07/27/2010 0937   CL 106 07/27/2010 0937   CL 106 07/27/2010 0937   CO2 26 07/27/2010 0937   CO2 26 07/27/2010 0937   BUN 20 07/27/2010 0937   BUN 20 07/27/2010 0937   CREATININE 0.88 07/27/2010 0937   CREATININE 0.88 07/27/2010 0937      Component Value Date/Time   CALCIUM 8.7 07/27/2010 0937   CALCIUM 8.7 07/27/2010 0937   ALKPHOS 56 07/27/2010 0937   ALKPHOS 56 07/27/2010 0937   AST 24 07/27/2010 0937   AST 24 07/27/2010 0937   ALT 14 07/27/2010 0937   ALT 14 07/27/2010 0937   BILITOT 0.6 07/27/2010 0937   BILITOT 0.6 07/27/2010 0937      Impression and Plan:  This is an 78 year old female with the following issues:   1. Myelodysplastic syndrome.  Hemoglobin has remained stable. Patient has not required Aranesp since May 2013. Patient has asked if we can spead out appointments a little further. Will change appointments from every 3 weeks to every 4 weeks per her request. She will receive Aranesp 4 weeks as needed to keep her hemoglobin above 10, along with supportive transfusions of red blood cells and platelets as needed. She will not get Aranesp today. 2. Anemia.  Again, she does have myelodysplastic syndrome as well as an element of B-cell lymphoproliferative disorder.  We will continue with Aranesp and supportive measures. She has not needed PRBC for a while now. 3. Thrombocytopenia.  Platelet count is stable. She has no active bleeding.  We will continue to monitor this. Her platelets are better now.  4. Neutropenia.  Her absolute neutrophil count is above 1000.  We will continue to monitor this. 5. Follow-up. In 3  months. She will continue to have a CBC and Aranesp injection q 4 weeks.   Misty Dunn 9/11/20131:06 PM

## 2012-01-31 ENCOUNTER — Other Ambulatory Visit (HOSPITAL_BASED_OUTPATIENT_CLINIC_OR_DEPARTMENT_OTHER): Payer: Medicare Other

## 2012-01-31 ENCOUNTER — Ambulatory Visit: Payer: Medicare Other

## 2012-01-31 DIAGNOSIS — D469 Myelodysplastic syndrome, unspecified: Secondary | ICD-10-CM

## 2012-01-31 DIAGNOSIS — D649 Anemia, unspecified: Secondary | ICD-10-CM

## 2012-01-31 LAB — CBC WITH DIFFERENTIAL/PLATELET
Basophils Absolute: 0 10*3/uL (ref 0.0–0.1)
Eosinophils Absolute: 0 10*3/uL (ref 0.0–0.5)
HCT: 32.7 % — ABNORMAL LOW (ref 34.8–46.6)
HGB: 10.7 g/dL — ABNORMAL LOW (ref 11.6–15.9)
MCH: 37.5 pg — ABNORMAL HIGH (ref 25.1–34.0)
MONO#: 0.6 10*3/uL (ref 0.1–0.9)
NEUT#: 1.8 10*3/uL (ref 1.5–6.5)
NEUT%: 49.2 % (ref 38.4–76.8)
RDW: 12.6 % (ref 11.2–14.5)
lymph#: 1.2 10*3/uL (ref 0.9–3.3)

## 2012-01-31 MED ORDER — DARBEPOETIN ALFA-POLYSORBATE 300 MCG/0.6ML IJ SOLN
300.0000 ug | INTRAMUSCULAR | Status: DC
  Filled 2012-01-31: qty 0.6

## 2012-02-28 ENCOUNTER — Other Ambulatory Visit (HOSPITAL_BASED_OUTPATIENT_CLINIC_OR_DEPARTMENT_OTHER): Payer: Medicare Other | Admitting: Lab

## 2012-02-28 ENCOUNTER — Ambulatory Visit: Payer: Medicare Other

## 2012-02-28 DIAGNOSIS — D469 Myelodysplastic syndrome, unspecified: Secondary | ICD-10-CM

## 2012-02-28 DIAGNOSIS — D649 Anemia, unspecified: Secondary | ICD-10-CM

## 2012-02-28 LAB — CBC WITH DIFFERENTIAL/PLATELET
Basophils Absolute: 0 10*3/uL (ref 0.0–0.1)
EOS%: 1 % (ref 0.0–7.0)
Eosinophils Absolute: 0 10*3/uL (ref 0.0–0.5)
HCT: 30.4 % — ABNORMAL LOW (ref 34.8–46.6)
HGB: 10.5 g/dL — ABNORMAL LOW (ref 11.6–15.9)
MCH: 40.3 pg — ABNORMAL HIGH (ref 25.1–34.0)
MCV: 116.8 fL — ABNORMAL HIGH (ref 79.5–101.0)
MONO%: 17.5 % — ABNORMAL HIGH (ref 0.0–14.0)
NEUT#: 1.8 10*3/uL (ref 1.5–6.5)
NEUT%: 52.7 % (ref 38.4–76.8)
Platelets: 51 10*3/uL — ABNORMAL LOW (ref 145–400)

## 2012-02-28 MED ORDER — DARBEPOETIN ALFA-POLYSORBATE 300 MCG/0.6ML IJ SOLN: 300.0000 ug | INTRAMUSCULAR | Status: DC

## 2012-03-27 ENCOUNTER — Ambulatory Visit: Payer: Medicare Other

## 2012-03-27 ENCOUNTER — Other Ambulatory Visit (HOSPITAL_BASED_OUTPATIENT_CLINIC_OR_DEPARTMENT_OTHER): Payer: Medicare Other | Admitting: Lab

## 2012-03-27 ENCOUNTER — Telehealth: Payer: Self-pay | Admitting: Oncology

## 2012-03-27 ENCOUNTER — Ambulatory Visit (HOSPITAL_BASED_OUTPATIENT_CLINIC_OR_DEPARTMENT_OTHER): Payer: Medicare Other | Admitting: Oncology

## 2012-03-27 VITALS — BP 138/66 | HR 72 | Temp 98.0°F | Resp 20 | Wt 105.2 lb

## 2012-03-27 DIAGNOSIS — D469 Myelodysplastic syndrome, unspecified: Secondary | ICD-10-CM

## 2012-03-27 DIAGNOSIS — D649 Anemia, unspecified: Secondary | ICD-10-CM

## 2012-03-27 DIAGNOSIS — D72810 Lymphocytopenia: Secondary | ICD-10-CM

## 2012-03-27 LAB — CBC WITH DIFFERENTIAL/PLATELET
Basophils Absolute: 0 10*3/uL (ref 0.0–0.1)
EOS%: 0.8 % (ref 0.0–7.0)
Eosinophils Absolute: 0 10*3/uL (ref 0.0–0.5)
LYMPH%: 31.5 % (ref 14.0–49.7)
MCH: 39.8 pg — ABNORMAL HIGH (ref 25.1–34.0)
MCV: 116.6 fL — ABNORMAL HIGH (ref 79.5–101.0)
MONO%: 17.7 % — ABNORMAL HIGH (ref 0.0–14.0)
NEUT#: 1.6 10*3/uL (ref 1.5–6.5)
Platelets: 55 10*3/uL — ABNORMAL LOW (ref 145–400)
RBC: 2.63 10*6/uL — ABNORMAL LOW (ref 3.70–5.45)
RDW: 13 % (ref 11.2–14.5)

## 2012-03-27 MED ORDER — DARBEPOETIN ALFA-POLYSORBATE 300 MCG/0.6ML IJ SOLN
300.0000 ug | INTRAMUSCULAR | Status: DC
  Filled 2012-03-27: qty 0.6

## 2012-03-27 NOTE — Telephone Encounter (Signed)
gv pt appt schedule for January thru May 2014.

## 2012-03-27 NOTE — Progress Notes (Signed)
Hematology and Oncology Follow Up Visit  Misty Dunn [id number] 23-Oct-1921 78 y.o. 03/27/2012 9:30 AM  CC: Hal T. Stoneking, M.D.    Principle Diagnosis:This is an 89-year female with myelodysplastic syndrome.  She also has an element of B-cell lymphoproliferative disorder diagnosed in the early part of 2012.  Prior Therapy:The patient received a course rituximab in March 2012 given in [place] with moderate response.   Current therapy: She is on Aranesp 300 mcg subcu every 4 weeks to keep her hemoglobin above 10, also was supportive transfusions as needed. Last Aransep given in 08/2011.   Interim History:  Misty Dunn presents today for an office followup visit.  Her sister accompanies her.  This is a pleasant 78 year old female with a history of myelodysplastic syndrome with pancytopenia related to that. Misty Dunn reports that she feels excellent.  She has a good energy level and performance status.  She is not reporting any recent illnesses, any new medications or recent hospitalizations.  She is able to get out and about and perform her activities of living independently without any hindrance or decline.  She has not reported any headaches, visual changes, dysphagia, odynophagia, any nausea, vomiting, diarrhea, constipation, chest pain, shortness of breath, productive cough, abdominal pain, abdominal swelling, lower extremity paresthesias, bowel or bladder incontinence, any fevers, chills or night sweats, any palpable lymph node swelling.  She has not reported any obvious bleeding such as epistaxis, gum bleeding, melena, hematochezia, hematuria or any type of hemoptysis.  Overall she reports she has a good appetite.  She is eating and drinking quite well. Weight is stable.  No recent illness or hospitalizations.    Medications: I have reviewed the patient's current medications. Current outpatient prescriptions:alendronate (FOSAMAX) 70 MG tablet, , Disp: , Rfl: ;  brinzolamide (AZOPT) 1 %  ophthalmic suspension, Place 1 drop into both eyes 2 (two) times daily.  , Disp: , Rfl: ;  Calcium Carbonate-Vit D-Min (CALTRATE PLUS PO), Take 1 tablet by mouth daily.  , Disp: , Rfl: ;  Multiple Vitamin (MULTIVITAMIN) tablet, Take 1 tablet by mouth daily.  , Disp: , Rfl:   Allergies:  Allergies  Allergen Reactions  . Codeine     Past Medical History, Surgical history, Social history, and Family History were reviewed and updated.  Review of Systems: Constitutional:  Negative for fever, chills, night sweats, anorexia, weight loss, pain. Cardiovascular: no chest pain or dyspnea on exertion Respiratory: no cough, shortness of breath, or wheezing Neurological: no TIA or stroke symptoms Dermatological: negative ENT: negative Gastrointestinal: no abdominal pain, change in bowel habits, or black or bloody stools Genito-Urinary: no dysuria, trouble voiding, or hematuria Hematological and Lymphatic: negative Breast: negative for breast lumps Musculoskeletal: negative Remaining ROS negative. Physical Exam: Blood pressure 138/66, pulse 72, temperature 98 F (36.7 C), temperature source Oral, resp. rate 20, weight 105 lb 4 oz (47.741 kg). ECOG: 1 General appearance: alert Head: Normocephalic, without obvious abnormality, atraumatic Neck: no adenopathy, no carotid bruit, no JVD, supple, symmetrical, trachea midline and thyroid not enlarged, symmetric, no tenderness/mass/nodules Lymph nodes: Cervical, supraclavicular, and axillary nodes normal. Heart:regular rate and rhythm, S1, S2 normal, no murmur, click, rub or gallop Lung:chest clear, no wheezing, rales, normal symmetric air entry, Heart exam - S1, S2 normal, no murmur, no gallop, rate regular Abdomen: soft, non-tender, without masses or organomegaly EXT:no erythema, induration, or nodules   Lab Results: Lab Results  Component Value Date   WBC 3.1* 03/27/2012   HGB 10.5* 03/27/2012   HCT  30.7* 03/27/2012   MCV 116.6* 03/27/2012   PLT  55* 03/27/2012     Chemistry      Component Value Date/Time   NA 139 07/27/2010 0937   NA 139 07/27/2010 0937   K 4.2 07/27/2010 0937   K 4.2 07/27/2010 0937   CL 106 07/27/2010 0937   CL 106 07/27/2010 0937   CO2 26 07/27/2010 0937   CO2 26 07/27/2010 0937   BUN 20 07/27/2010 0937   BUN 20 07/27/2010 0937   CREATININE 0.88 07/27/2010 0937   CREATININE 0.88 07/27/2010 0937      Component Value Date/Time   CALCIUM 8.7 07/27/2010 0937   CALCIUM 8.7 07/27/2010 0937   ALKPHOS 56 07/27/2010 0937   ALKPHOS 56 07/27/2010 0937   AST 24 07/27/2010 0937   AST 24 07/27/2010 0937   ALT 14 07/27/2010 0937   ALT 14 07/27/2010 0937   BILITOT 0.6 07/27/2010 0937   BILITOT 0.6 07/27/2010 0937      Impression and Plan:  This is an 78 year old female with the following issues:   1. Myelodysplastic syndrome.  Hemoglobin has remained stable. Patient has not required Aranesp since May 2013. Patient has asked if we can spead out appointments a little further. She will receive Aranesp 4 weeks as needed to keep her hemoglobin above 10, along with supportive transfusions of red blood cells and platelets as needed. She will not get Aranesp today. After 04/2012, we will switch her to every other month labs and possible injection. 2. Anemia.  Again, she does have myelodysplastic syndrome as well as an element of B-cell lymphoproliferative disorder.  We will continue with Aranesp and supportive measures. She has not needed PRBC for a while now. 3. Thrombocytopenia.  Platelet count is stable. She has no active bleeding.  We will continue to monitor this. Her platelets are better now.  4. Neutropenia.  Her absolute neutrophil count is above 1000.  We will continue to monitor this. 5. Follow-up. In 08/2012.   Jacori Mulrooney 12/4/20139:30 AM

## 2012-04-25 ENCOUNTER — Ambulatory Visit: Payer: Medicare Other

## 2012-04-25 ENCOUNTER — Other Ambulatory Visit (HOSPITAL_BASED_OUTPATIENT_CLINIC_OR_DEPARTMENT_OTHER): Payer: Medicare Other | Admitting: Lab

## 2012-04-25 DIAGNOSIS — D649 Anemia, unspecified: Secondary | ICD-10-CM

## 2012-04-25 LAB — CBC WITH DIFFERENTIAL/PLATELET
BASO%: 0.2 % (ref 0.0–2.0)
LYMPH%: 27.8 % (ref 14.0–49.7)
MCHC: 34.6 g/dL (ref 31.5–36.0)
MCV: 116.8 fL — ABNORMAL HIGH (ref 79.5–101.0)
MONO%: 18.8 % — ABNORMAL HIGH (ref 0.0–14.0)
NEUT#: 1.7 10*3/uL (ref 1.5–6.5)
Platelets: 60 10*3/uL — ABNORMAL LOW (ref 145–400)
RBC: 2.61 10*6/uL — ABNORMAL LOW (ref 3.70–5.45)
RDW: 13.4 % (ref 11.2–14.5)
WBC: 3.2 10*3/uL — ABNORMAL LOW (ref 3.9–10.3)

## 2012-04-25 NOTE — Progress Notes (Signed)
Hgb 10.5   No need for Aranesp. Spoke with patient in lobby and gave copy of labs to patient.

## 2012-06-26 ENCOUNTER — Other Ambulatory Visit (HOSPITAL_BASED_OUTPATIENT_CLINIC_OR_DEPARTMENT_OTHER): Payer: Medicare Other | Admitting: Lab

## 2012-06-26 ENCOUNTER — Ambulatory Visit: Payer: Medicare Other

## 2012-06-26 DIAGNOSIS — D469 Myelodysplastic syndrome, unspecified: Secondary | ICD-10-CM

## 2012-06-26 LAB — CBC WITH DIFFERENTIAL/PLATELET
BASO%: 0.5 % (ref 0.0–2.0)
EOS%: 0.8 % (ref 0.0–7.0)
HCT: 31.7 % — ABNORMAL LOW (ref 34.8–46.6)
MCH: 39.5 pg — ABNORMAL HIGH (ref 25.1–34.0)
MCHC: 34.5 g/dL (ref 31.5–36.0)
MONO#: 0.6 10*3/uL (ref 0.1–0.9)
NEUT%: 56.3 % (ref 38.4–76.8)
RBC: 2.76 10*6/uL — ABNORMAL LOW (ref 3.70–5.45)
WBC: 3.2 10*3/uL — ABNORMAL LOW (ref 3.9–10.3)
lymph#: 0.8 10*3/uL — ABNORMAL LOW (ref 0.9–3.3)

## 2012-06-26 MED ORDER — DARBEPOETIN ALFA-POLYSORBATE 300 MCG/0.6ML IJ SOLN
300.0000 ug | INTRAMUSCULAR | Status: DC
  Filled 2012-06-26: qty 0.6

## 2012-08-13 ENCOUNTER — Other Ambulatory Visit (HOSPITAL_COMMUNITY): Payer: Self-pay | Admitting: Geriatric Medicine

## 2012-08-13 DIAGNOSIS — Z1231 Encounter for screening mammogram for malignant neoplasm of breast: Secondary | ICD-10-CM

## 2012-08-23 ENCOUNTER — Ambulatory Visit (HOSPITAL_COMMUNITY)
Admission: RE | Admit: 2012-08-23 | Discharge: 2012-08-23 | Disposition: A | Discharge status: Home / Self Care | Payer: Medicare Other | Source: Ambulatory Visit | Attending: Geriatric Medicine | Admitting: Geriatric Medicine

## 2012-08-23 DIAGNOSIS — Z1231 Encounter for screening mammogram for malignant neoplasm of breast: Secondary | ICD-10-CM | POA: Insufficient documentation

## 2012-08-28 ENCOUNTER — Ambulatory Visit (HOSPITAL_BASED_OUTPATIENT_CLINIC_OR_DEPARTMENT_OTHER): Payer: Medicare Other | Admitting: Oncology

## 2012-08-28 ENCOUNTER — Telehealth: Payer: Self-pay | Admitting: Oncology

## 2012-08-28 ENCOUNTER — Ambulatory Visit: Payer: Medicare Other

## 2012-08-28 ENCOUNTER — Other Ambulatory Visit (HOSPITAL_BASED_OUTPATIENT_CLINIC_OR_DEPARTMENT_OTHER): Payer: Medicare Other | Admitting: Lab

## 2012-08-28 VITALS — BP 159/51 | HR 78 | Temp 97.8°F | Resp 18 | Ht 59.0 in | Wt 102.9 lb

## 2012-08-28 DIAGNOSIS — D469 Myelodysplastic syndrome, unspecified: Secondary | ICD-10-CM

## 2012-08-28 DIAGNOSIS — D649 Anemia, unspecified: Secondary | ICD-10-CM

## 2012-08-28 LAB — CBC WITH DIFFERENTIAL/PLATELET
BASO%: 0.4 % (ref 0.0–2.0)
Basophils Absolute: 0 10*3/uL (ref 0.0–0.1)
EOS%: 1.7 % (ref 0.0–7.0)
HCT: 32.6 % — ABNORMAL LOW (ref 34.8–46.6)
HGB: 10.9 g/dL — ABNORMAL LOW (ref 11.6–15.9)
MCH: 38.4 pg — ABNORMAL HIGH (ref 25.1–34.0)
MCHC: 33.5 g/dL (ref 31.5–36.0)
MONO#: 0.7 10*3/uL (ref 0.1–0.9)
RDW: 13.3 % (ref 11.2–14.5)
WBC: 4.2 10*3/uL (ref 3.9–10.3)
lymph#: 0.8 10*3/uL — ABNORMAL LOW (ref 0.9–3.3)

## 2012-08-28 MED ORDER — DARBEPOETIN ALFA-POLYSORBATE 300 MCG/0.6ML IJ SOLN
300.0000 ug | INTRAMUSCULAR | Status: DC
  Filled 2012-08-28: qty 0.6

## 2012-08-28 NOTE — Progress Notes (Signed)
Hematology and Oncology Follow Up Visit  Journi Moffa Cranmer [id number] 1921/11/03 79 y.o. 08/28/2012 9:39 AM  CC: Hal T. Stoneking, M.D.    Principle Diagnosis:This is an 89-year female with myelodysplastic syndrome.  She also has an element of B-cell lymphoproliferative disorder diagnosed in the early part of 2012.  Prior Therapy:The patient received a course rituximab in March 2012 given in [place] with moderate response.   Current therapy: She is on Aranesp 300 mcg subcu every 4 weeks to keep her hemoglobin above 10, also was supportive transfusions as needed. Last Aransep given in 08/2011.   Interim History:  Ms. Hartinger presents today for an office followup visit.  Her sister accompanies her. She is a  pleasant 79 year old female with a history of myelodysplastic syndrome with pancytopenia related to that. Ms. Price reports that she feels excellent.  She has a good energy level and performance status.  She is not reporting any recent illnesses, any new medications or recent hospitalizations.  She is able to get out and about and perform her activities of living independently without any hindrance or decline.  She has not reported any obvious bleeding such as epistaxis, gum bleeding, melena, hematochezia, hematuria or any type of hemoptysis.  Overall she reports she has a good appetite.  She is eating and drinking quite well. Weight is stable. Her husband died recently and she is coping well. Her sister reports that actually she is feeling less pressure since her husband died.     Medications: I have reviewed the patient's current medications. Current outpatient prescriptions:alendronate (FOSAMAX) 70 MG tablet, , Disp: , Rfl: ;  brinzolamide (AZOPT) 1 % ophthalmic suspension, Place 1 drop into both eyes 2 (two) times daily.  , Disp: , Rfl: ;  Calcium Carbonate-Vit D-Min (CALTRATE PLUS PO), Take 1 tablet by mouth daily.  , Disp: , Rfl: ;  dorzolamide-timolol (COSOPT) 22.3-6.8 MG/ML ophthalmic  solution, Apply 1 drop to eye daily. Both eyes, Disp: , Rfl:  latanoprost (XALATAN) 0.005 % ophthalmic solution, Apply 1 drop to eye daily. Both eyes, Disp: , Rfl: ;  Multiple Vitamin (MULTIVITAMIN) tablet, Take 1 tablet by mouth daily.  , Disp: , Rfl:   Allergies:  Allergies  Allergen Reactions  . Codeine     Past Medical History, Surgical history, Social history, and Family History were reviewed and updated.  Review of Systems: Constitutional:  Negative for fever, chills, night sweats, anorexia, weight loss, pain. Cardiovascular: no chest pain or dyspnea on exertion Respiratory: no cough, shortness of breath, or wheezing Neurological: no TIA or stroke symptoms Dermatological: negative ENT: negative Gastrointestinal: no abdominal pain, change in bowel habits, or black or bloody stools Genito-Urinary: no dysuria, trouble voiding, or hematuria Hematological and Lymphatic: negative Breast: negative for breast lumps Musculoskeletal: negative Remaining ROS negative. Physical Exam: Blood pressure 159/51, pulse 78, temperature 97.8 F (36.6 C), temperature source Oral, resp. rate 18, height 4\' 11"  (1.499 m), weight 102 lb 14.4 oz (46.675 kg), SpO2 98.00%. ECOG: 1 General appearance: alert Head: Normocephalic, without obvious abnormality, atraumatic Neck: no adenopathy, no carotid bruit, no JVD, supple, symmetrical, trachea midline and thyroid not enlarged, symmetric, no tenderness/mass/nodules Lymph nodes: Cervical, supraclavicular, and axillary nodes normal. Heart:regular rate and rhythm, S1, S2 normal, no murmur, click, rub or gallop Lung:chest clear, no wheezing, rales, normal symmetric air entry, Heart exam - S1, S2 normal, no murmur, no gallop, rate regular Abdomen: soft, non-tender, without masses or organomegaly EXT:no erythema, induration, or nodules   Lab Results: Lab Results  Component Value Date   WBC 4.2 08/28/2012   HGB 10.9* 08/28/2012   HCT 32.6* 08/28/2012   MCV 114.7*  08/28/2012   PLT 73* 08/28/2012     Chemistry      Component Value Date/Time   NA 139 07/27/2010 0937   K 4.2 07/27/2010 0937   CL 106 07/27/2010 0937   CO2 26 07/27/2010 0937   BUN 20 07/27/2010 0937   CREATININE 0.88 07/27/2010 0937      Component Value Date/Time   CALCIUM 8.7 07/27/2010 0937   ALKPHOS 56 07/27/2010 0937   AST 24 07/27/2010 0937   ALT 14 07/27/2010 0937   BILITOT 0.6 07/27/2010 0937      Impression and Plan:  This is an 79 year old female with the following issues:   1. Myelodysplastic syndrome.  Hemoglobin has remained stable. Patient has not required Aranesp since May 2013. Patient has asked if we can spead out appointments a little further. I will see her back in 4 months. 2. Anemia.  Again, she does have myelodysplastic syndrome as well as an element of B-cell lymphoproliferative disorder.  We will continue with Aranesp and supportive measures. She has not needed PRBC for a while now. 3. Thrombocytopenia.  Platelet count is stable. She has no active bleeding.  We will continue to monitor this. Her platelets are better now.  4. Neutropenia.  Her absolute neutrophil count is above 1000.  We will continue to monitor this. 5. Follow-up. In [id number].   Rea Kalama 5/7/20149:39 AM

## 2013-01-01 ENCOUNTER — Other Ambulatory Visit (HOSPITAL_BASED_OUTPATIENT_CLINIC_OR_DEPARTMENT_OTHER): Payer: Medicare Other | Admitting: Lab

## 2013-01-01 ENCOUNTER — Ambulatory Visit (HOSPITAL_BASED_OUTPATIENT_CLINIC_OR_DEPARTMENT_OTHER): Payer: Medicare Other | Admitting: Oncology

## 2013-01-01 ENCOUNTER — Telehealth: Payer: Self-pay | Admitting: Oncology

## 2013-01-01 VITALS — BP 145/48 | HR 64 | Temp 97.8°F | Resp 18 | Ht 59.0 in | Wt 101.7 lb

## 2013-01-01 DIAGNOSIS — D469 Myelodysplastic syndrome, unspecified: Secondary | ICD-10-CM

## 2013-01-01 DIAGNOSIS — D696 Thrombocytopenia, unspecified: Secondary | ICD-10-CM

## 2013-01-01 DIAGNOSIS — D709 Neutropenia, unspecified: Secondary | ICD-10-CM

## 2013-01-01 LAB — CBC WITH DIFFERENTIAL/PLATELET
EOS%: 1.4 % (ref 0.0–7.0)
HGB: 11.4 g/dL — ABNORMAL LOW (ref 11.6–15.9)
MCH: 38.1 pg — ABNORMAL HIGH (ref 25.1–34.0)
MCV: 111.9 fL — ABNORMAL HIGH (ref 79.5–101.0)
MONO%: 20.2 % — ABNORMAL HIGH (ref 0.0–14.0)
NEUT#: 1.9 10*3/uL (ref 1.5–6.5)
RBC: 2.98 10*6/uL — ABNORMAL LOW (ref 3.70–5.45)
RDW: 13.5 % (ref 11.2–14.5)
lymph#: 1 10*3/uL (ref 0.9–3.3)

## 2013-01-01 LAB — COMPREHENSIVE METABOLIC PANEL (CC13)
ALT: 12 U/L (ref 0–55)
AST: 23 U/L (ref 5–34)
Albumin: 3.7 g/dL (ref 3.5–5.0)
Alkaline Phosphatase: 49 U/L (ref 40–150)
Calcium: 9.1 mg/dL (ref 8.4–10.4)
Chloride: 107 mEq/L (ref 98–109)
Potassium: 4.6 mEq/L (ref 3.5–5.1)
Sodium: 141 mEq/L (ref 136–145)
Total Protein: 6.8 g/dL (ref 6.4–8.3)

## 2013-01-01 NOTE — Telephone Encounter (Signed)
gv and pritned appt sched and avs forpt for Jan 2015

## 2013-01-01 NOTE — Progress Notes (Signed)
Hematology and Oncology Follow Up Visit  Misty Dunn [id number] [birth year] 79 y.o. 01/01/2013 9:48 AM  CC: Hal T. Stoneking, M.D.    Principle Diagnosis:This is an 89-year female with myelodysplastic syndrome.  She also has an element of B-cell lymphoproliferative disorder diagnosed in the early part of 2012.  Prior Therapy:The patient received a course rituximab in March 2012 given in [place] with moderate response.   Current therapy: She was on  Aranesp 300 mcg subcu every 4 weeks to keep her hemoglobin above 10, also was supportive transfusions as needed. Last Aransep given in 08/2011.   Interim History:  Misty Dunn presents today for an office followup visit.  Her sister accompanies her. She is a  pleasant 79 year old female with a history of myelodysplastic syndrome with pancytopenia related to that. Misty Dunn reports that she feels excellent.  She has a good energy level and performance status.  She is not reporting any recent illnesses, any new medications or recent hospitalizations.  She is able to get out and about and perform her activities of living independently without any hindrance or decline.  She has not reported any obvious bleeding such as epistaxis, gum bleeding, melena, hematochezia, hematuria or any type of hemoptysis.  Overall she reports she has a good appetite.  She is eating and drinking quite well. Weight is stable.      Medications: I have reviewed the patient's current medications. Current Outpatient Prescriptions  Medication Sig Dispense Refill  . alendronate (FOSAMAX) 70 MG tablet       . brinzolamide (AZOPT) 1 % ophthalmic suspension Place 1 drop into both eyes 2 (two) times daily.        . Calcium Carbonate-Vit D-Min (CALTRATE PLUS PO) Take 1 tablet by mouth daily.        . dorzolamide-timolol (COSOPT) 22.3-6.8 MG/ML ophthalmic solution Apply 1 drop to eye daily. Both eyes      . latanoprost (XALATAN) 0.005 % ophthalmic solution Apply 1 drop to eye  daily. Both eyes      . Multiple Vitamin (MULTIVITAMIN) tablet Take 1 tablet by mouth daily.         No current facility-administered medications for this visit.    Allergies:  Allergies  Allergen Reactions  . Codeine     Past Medical History, Surgical history, Social history, and Family History were reviewed and updated.  Review of Systems:  Remaining ROS negative. Physical Exam: Blood pressure 145/48, pulse 64, temperature 97.8 F (36.6 C), temperature source Oral, resp. rate 18, height 4\' 11"  (1.499 m), weight 101 lb 11.2 oz (46.131 kg), SpO2 98.00%. ECOG: 1 General appearance: alert Head: Normocephalic, without obvious abnormality, atraumatic Neck: no adenopathy, no carotid bruit, no JVD, supple, symmetrical, trachea midline and thyroid not enlarged, symmetric, no tenderness/mass/nodules Lymph nodes: Cervical, supraclavicular, and axillary nodes normal. Heart:regular rate and rhythm, S1, S2 normal, no murmur, click, rub or gallop Lung:chest clear, no wheezing, rales, normal symmetric air entry, Heart exam - S1, S2 normal, no murmur, no gallop, rate regular Abdomen: soft, non-tender, without masses or organomegaly EXT:no erythema, induration, or nodules   Lab Results: Lab Results  Component Value Date   WBC 3.8* 01/01/2013   HGB 11.4* 01/01/2013   HCT 33.4* 01/01/2013   MCV 111.9* 01/01/2013   PLT 81* 01/01/2013     Chemistry      Component Value Date/Time   NA 139 07/27/2010 0937   K 4.2 07/27/2010 0937   CL 106 07/27/2010 0937   CO2 26  07/27/2010 0937   BUN 20 07/27/2010 0937   CREATININE 0.88 07/27/2010 0937      Component Value Date/Time   CALCIUM 8.7 07/27/2010 0937   ALKPHOS 56 07/27/2010 0937   AST 24 07/27/2010 0937   ALT 14 07/27/2010 0937   BILITOT 0.6 07/27/2010 0937      Impression and Plan:  This is an 79 year old female with the following issues:   1. Myelodysplastic syndrome.  Hemoglobin continues to improve despite no intervention for the time being. Patient has  not required Aranesp since May 2013. . 2. Anemia.  Again, she does have myelodysplastic syndrome as well as an element of B-cell lymphoproliferative disorder.  We will continue with Aranesp and supportive measures. She has not needed PRBC for a while now. 3. Thrombocytopenia.  Platelet count is improving.  4. Neutropenia.  Her absolute neutrophil count is above 1000.  We will continue to monitor this. 5. Follow-up. In 04/2013.   Misty Dunn 9/10/20149:48 AM

## 2013-05-20 ENCOUNTER — Telehealth: Payer: Self-pay | Admitting: Oncology

## 2013-05-20 ENCOUNTER — Other Ambulatory Visit (HOSPITAL_BASED_OUTPATIENT_CLINIC_OR_DEPARTMENT_OTHER): Payer: Medicare Other

## 2013-05-20 ENCOUNTER — Ambulatory Visit (HOSPITAL_BASED_OUTPATIENT_CLINIC_OR_DEPARTMENT_OTHER): Payer: Medicare Other | Admitting: Oncology

## 2013-05-20 VITALS — BP 132/63 | HR 71 | Temp 97.0°F | Resp 18 | Ht 59.0 in | Wt 99.5 lb

## 2013-05-20 DIAGNOSIS — D469 Myelodysplastic syndrome, unspecified: Secondary | ICD-10-CM

## 2013-05-20 DIAGNOSIS — D649 Anemia, unspecified: Secondary | ICD-10-CM

## 2013-05-20 DIAGNOSIS — D696 Thrombocytopenia, unspecified: Secondary | ICD-10-CM

## 2013-05-20 DIAGNOSIS — D709 Neutropenia, unspecified: Secondary | ICD-10-CM

## 2013-05-20 LAB — COMPREHENSIVE METABOLIC PANEL (CC13)
ALT: 18 U/L (ref 0–55)
ANION GAP: 7 meq/L (ref 3–11)
AST: 25 U/L (ref 5–34)
Albumin: 3.7 g/dL (ref 3.5–5.0)
Alkaline Phosphatase: 53 U/L (ref 40–150)
BILIRUBIN TOTAL: 0.45 mg/dL (ref 0.20–1.20)
BUN: 20.8 mg/dL (ref 7.0–26.0)
CALCIUM: 9.4 mg/dL (ref 8.4–10.4)
CHLORIDE: 107 meq/L (ref 98–109)
CO2: 27 meq/L (ref 22–29)
CREATININE: 0.8 mg/dL (ref 0.6–1.1)
GLUCOSE: 88 mg/dL (ref 70–140)
Potassium: 4.4 mEq/L (ref 3.5–5.1)
Sodium: 141 mEq/L (ref 136–145)
TOTAL PROTEIN: 6.5 g/dL (ref 6.4–8.3)

## 2013-05-20 LAB — CBC WITH DIFFERENTIAL/PLATELET
BASO%: 0.6 % (ref 0.0–2.0)
Basophils Absolute: 0 10*3/uL (ref 0.0–0.1)
EOS ABS: 0.1 10*3/uL (ref 0.0–0.5)
EOS%: 2.2 % (ref 0.0–7.0)
HEMATOCRIT: 32 % — AB (ref 34.8–46.6)
HEMOGLOBIN: 10.8 g/dL — AB (ref 11.6–15.9)
LYMPH#: 0.7 10*3/uL — AB (ref 0.9–3.3)
LYMPH%: 17.6 % (ref 14.0–49.7)
MCH: 37.8 pg — ABNORMAL HIGH (ref 25.1–34.0)
MCHC: 33.8 g/dL (ref 31.5–36.0)
MCV: 111.9 fL — ABNORMAL HIGH (ref 79.5–101.0)
MONO#: 0.7 10*3/uL (ref 0.1–0.9)
MONO%: 18.3 % — ABNORMAL HIGH (ref 0.0–14.0)
NEUT%: 61.3 % (ref 38.4–76.8)
NEUTROS ABS: 2.5 10*3/uL (ref 1.5–6.5)
PLATELETS: 86 10*3/uL — AB (ref 145–400)
RBC: 2.86 10*6/uL — ABNORMAL LOW (ref 3.70–5.45)
RDW: 12.9 % (ref 11.2–14.5)
WBC: 4.1 10*3/uL (ref 3.9–10.3)

## 2013-05-20 NOTE — Telephone Encounter (Signed)
gave pt appt for lab and MD on May 2015 °

## 2013-05-20 NOTE — Progress Notes (Signed)
Hematology and Oncology Follow Up Visit  Misty Dunn [id number] [birth year] 80 y.o. 05/20/2013 10:21 AM  CC: Misty Dunn, M.D.    Principle Diagnosis:This is an 77-year female with myelodysplastic syndrome.  She also has an element of B-cell lymphoproliferative disorder diagnosed in the early part of 2012.  Prior Therapy:The patient received a course rituximab in March 2012 given in [place] with moderate response.   Current therapy: She was on  Aranesp 300 mcg subcu every 4 weeks to keep her hemoglobin above 10, also was supportive transfusions as needed. Last Aransep given in 08/2011.   Interim History:  Misty Dunn presents today for an office followup visit.  Her sister accompanies her. She is a  pleasant 80 year old female with a history of myelodysplastic syndrome with pancytopenia related to that. Misty Dunn reports that she feels excellent.  She has a good energy level and performance status.  She is not reporting any recent illnesses, any new medications or recent hospitalizations.  She is able to get out and about and perform her activities of living independently without any hindrance or decline.  She has not reported any obvious bleeding such as epistaxis, gum bleeding, melena, hematochezia, hematuria or any type of hemoptysis.  Overall she reports she has a good appetite.  She is eating and drinking quite well. Weight is stable. She returned rupture to [place] without any complications.      Medications: I have reviewed the patient's current medications. Current Outpatient Prescriptions  Medication Sig Dispense Refill  . alendronate (FOSAMAX) 70 MG tablet       . brinzolamide (AZOPT) 1 % ophthalmic suspension Place 1 drop into both eyes 2 (two) times daily.        . Calcium Carbonate-Vit D-Min (CALTRATE PLUS PO) Take 1 tablet by mouth daily.        . dorzolamide-timolol (COSOPT) 22.3-6.8 MG/ML ophthalmic solution Apply 1 drop to eye daily. Both eyes      . latanoprost  (XALATAN) 0.005 % ophthalmic solution Apply 1 drop to eye daily. Both eyes      . Multiple Vitamin (MULTIVITAMIN) tablet Take 1 tablet by mouth daily.         No current facility-administered medications for this visit.    Allergies:  Allergies  Allergen Reactions  . Codeine     Past Medical History, Surgical history, Social history, and Family History were reviewed and updated.  Review of Systems:  Remaining ROS negative. Physical Exam: Blood pressure 132/63, pulse 71, temperature 97 F (36.1 C), temperature source Oral, resp. rate 18, height 4\' 11"  (1.499 m), weight 99 lb 8 oz (45.133 kg). ECOG: 1 General appearance: alert Head: Normocephalic, without obvious abnormality, atraumatic Neck: no adenopathy, no carotid bruit, no JVD, supple, symmetrical, trachea midline and thyroid not enlarged, symmetric, no tenderness/mass/nodules Lymph nodes: Cervical, supraclavicular, and axillary nodes normal. Heart:regular rate and rhythm, S1, S2 normal, no murmur, click, rub or gallop Lung:chest clear, no wheezing, rales, normal symmetric air entry, Heart exam - S1, S2 normal, no murmur, no gallop, rate regular Abdomen: soft, non-tender, without masses or organomegaly EXT:no erythema, induration, or nodules   Lab Results: Lab Results  Component Value Date   WBC 4.1 05/20/2013   HGB 10.8* 05/20/2013   HCT 32.0* 05/20/2013   MCV 111.9* 05/20/2013   PLT 86* 05/20/2013     Chemistry      Component Value Date/Time   NA 141 01/01/2013 0917   NA 139 07/27/2010 0937   K 4.6  01/01/2013 0917   K 4.2 07/27/2010 0937   CL 106 07/27/2010 0937   CO2 28 01/01/2013 0917   CO2 26 07/27/2010 0937   BUN 19.9 01/01/2013 0917   BUN 20 07/27/2010 0937   CREATININE 0.8 01/01/2013 0917   CREATININE 0.88 07/27/2010 0937      Component Value Date/Time   CALCIUM 9.1 01/01/2013 0917   CALCIUM 8.7 07/27/2010 0937   ALKPHOS 49 01/01/2013 0917   ALKPHOS 56 07/27/2010 0937   AST 23 01/01/2013 0917   AST 24 07/27/2010 0937   ALT  12 01/01/2013 0917   ALT 14 07/27/2010 0937   BILITOT 0.50 01/01/2013 0917   BILITOT 0.6 07/27/2010 0937      Impression and Plan:  This is an 80 year old female with the following issues:   1. Myelodysplastic syndrome.  Hemoglobin continues to improve despite no intervention for the time being. Patient has not required Aranesp since May 2013. . 2. Anemia.  Again, she does have myelodysplastic syndrome as well as an element of B-cell lymphoproliferative disorder.  We will continue with Aranesp and supportive measures. She has not needed PRBC for a while now. 3. Thrombocytopenia.  Platelet count is improving.  4. Neutropenia.  Her absolute neutrophil count is above 1000.  We will continue to monitor this. 5. Follow-up. In 08/2013.   Trevelle Mcgurn 1/27/201510:21 AM

## 2013-09-10 ENCOUNTER — Other Ambulatory Visit (HOSPITAL_BASED_OUTPATIENT_CLINIC_OR_DEPARTMENT_OTHER): Payer: Medicare Other

## 2013-09-10 ENCOUNTER — Encounter: Payer: Self-pay | Admitting: Oncology

## 2013-09-10 ENCOUNTER — Ambulatory Visit (HOSPITAL_BASED_OUTPATIENT_CLINIC_OR_DEPARTMENT_OTHER): Payer: Medicare Other | Admitting: Oncology

## 2013-09-10 ENCOUNTER — Telehealth: Payer: Self-pay | Admitting: Oncology

## 2013-09-10 VITALS — BP 146/72 | HR 65 | Temp 97.7°F | Resp 20 | Ht 59.0 in | Wt 98.0 lb

## 2013-09-10 DIAGNOSIS — D469 Myelodysplastic syndrome, unspecified: Secondary | ICD-10-CM

## 2013-09-10 DIAGNOSIS — D696 Thrombocytopenia, unspecified: Secondary | ICD-10-CM

## 2013-09-10 DIAGNOSIS — D709 Neutropenia, unspecified: Secondary | ICD-10-CM

## 2013-09-10 DIAGNOSIS — D649 Anemia, unspecified: Secondary | ICD-10-CM

## 2013-09-10 LAB — COMPREHENSIVE METABOLIC PANEL (CC13)
ALBUMIN: 3.8 g/dL (ref 3.5–5.0)
ALT: 18 U/L (ref 0–55)
ANION GAP: 9 meq/L (ref 3–11)
AST: 27 U/L (ref 5–34)
Alkaline Phosphatase: 58 U/L (ref 40–150)
BUN: 19.1 mg/dL (ref 7.0–26.0)
CALCIUM: 9.1 mg/dL (ref 8.4–10.4)
CHLORIDE: 107 meq/L (ref 98–109)
CO2: 27 meq/L (ref 22–29)
CREATININE: 0.8 mg/dL (ref 0.6–1.1)
Glucose: 63 mg/dl — ABNORMAL LOW (ref 70–140)
POTASSIUM: 4.4 meq/L (ref 3.5–5.1)
SODIUM: 143 meq/L (ref 136–145)
TOTAL PROTEIN: 6.8 g/dL (ref 6.4–8.3)
Total Bilirubin: 0.47 mg/dL (ref 0.20–1.20)

## 2013-09-10 LAB — CBC WITH DIFFERENTIAL/PLATELET
BASO%: 0.8 % (ref 0.0–2.0)
Basophils Absolute: 0 10*3/uL (ref 0.0–0.1)
EOS%: 1.3 % (ref 0.0–7.0)
Eosinophils Absolute: 0 10*3/uL (ref 0.0–0.5)
HEMATOCRIT: 36.5 % (ref 34.8–46.6)
HEMOGLOBIN: 12 g/dL (ref 11.6–15.9)
LYMPH#: 1 10*3/uL (ref 0.9–3.3)
LYMPH%: 26.8 % (ref 14.0–49.7)
MCH: 36 pg — AB (ref 25.1–34.0)
MCHC: 32.8 g/dL (ref 31.5–36.0)
MCV: 110 fL — ABNORMAL HIGH (ref 79.5–101.0)
MONO#: 0.7 10*3/uL (ref 0.1–0.9)
MONO%: 17.2 % — ABNORMAL HIGH (ref 0.0–14.0)
NEUT#: 2.1 10*3/uL (ref 1.5–6.5)
NEUT%: 53.9 % (ref 38.4–76.8)
Platelets: 96 10*3/uL — ABNORMAL LOW (ref 145–400)
RBC: 3.32 10*6/uL — ABNORMAL LOW (ref 3.70–5.45)
RDW: 13.2 % (ref 11.2–14.5)
WBC: 3.8 10*3/uL — AB (ref 3.9–10.3)

## 2013-09-10 NOTE — Progress Notes (Signed)
Hematology and Oncology Follow Up Visit  Misty Dunn [id number] 08/29/1921 80 y.o. 09/10/2013 10:23 AM  CC: Misty Dunn, M.D.    Principle Diagnosis:This is an 89-year female with myelodysplastic syndrome.  She also has an element of B-cell lymphoproliferative disorder diagnosed in the early part of 2012.  Prior Therapy: She received a course rituximab in March 2012 given in [place] with moderate response.  She was on  Aranesp 300 mcg subcu every 4 weeks to keep her hemoglobin above 10, also was supportive transfusions as needed. Last Aransep given in 08/2011.   Current therapy: Observation and surveillance.  Interim History:  Misty Dunn presents today for an office followup visit.  Her sister accompanies her. Misty Dunn reports that she is doing well.  She has a good energy level and performance status.  She is not reporting any recent illnesses, any new medications or recent hospitalizations.  She is able to get out and about and perform her activities of living independently without any hindrance or decline.  She has not reported any obvious bleeding such as epistaxis, gum bleeding, melena, hematochezia, hematuria or any type of hemoptysis.  She has not reported any headaches blurred vision or double vision. Has not reported any falls or syncope. She had not reported any chest pain orthopnea or PND. Has not reported any skin rashes or lesions. Rest or view of system is unremarkable.  Medications: I have reviewed the patient's current medications. Unchanged by my review. Current Outpatient Prescriptions  Medication Sig Dispense Refill  . alendronate (FOSAMAX) 70 MG tablet       . brinzolamide (AZOPT) 1 % ophthalmic suspension Place 1 drop into both eyes 2 (two) times daily.        . Calcium Carbonate-Vit D-Min (CALTRATE PLUS PO) Take 1 tablet by mouth daily.        . dorzolamide-timolol (COSOPT) 22.3-6.8 MG/ML ophthalmic solution Apply 1 drop to eye daily. Both eyes      .  latanoprost (XALATAN) 0.005 % ophthalmic solution Apply 1 drop to eye daily. Both eyes      . Multiple Vitamin (MULTIVITAMIN) tablet Take 1 tablet by mouth daily.         No current facility-administered medications for this visit.    Allergies:  Allergies  Allergen Reactions  . Codeine     Past Medical History, Surgical history, Social history, and Family History were reviewed and updated.   Physical Exam: Blood pressure 146/72, pulse 65, temperature 97.7 F (36.5 C), temperature source Oral, resp. rate 20, height 4\' 11"  (1.499 m), weight 98 lb (44.453 kg). ECOG: 1 General appearance: alert awake not in any distress the Head: Normocephalic, without obvious abnormality, atraumatic Neck: no adenopathy, no masses. Lymph nodes: Cervical, supraclavicular, and axillary nodes normal. Heart:regular rate and rhythm, S1, S2 normal, no murmur, click, rub or gallop Lung:chest clear, no wheezing, rales, normal symmetric air entry, Heart exam - S1, S2 normal, no murmur, no gallop, rate regular Abdomen: soft, non-tender, without masses or organomegaly EXT:no erythema, induration, or nodules   Lab Results: Lab Results  Component Value Date   WBC 3.8* 09/10/2013   HGB 12.0 09/10/2013   HCT 36.5 09/10/2013   MCV 110.0* 09/10/2013   PLT 96* 09/10/2013     Chemistry      Component Value Date/Time   NA 141 05/20/2013 0945   NA 139 07/27/2010 0937   K 4.4 05/20/2013 0945   K 4.2 07/27/2010 0937   CL 106 07/27/2010 5009  CO2 27 05/20/2013 0945   CO2 26 07/27/2010 0937   BUN 20.8 05/20/2013 0945   BUN 20 07/27/2010 0937   CREATININE 0.8 05/20/2013 0945   CREATININE 0.88 07/27/2010 0937      Component Value Date/Time   CALCIUM 9.4 05/20/2013 0945   CALCIUM 8.7 07/27/2010 0937   ALKPHOS 53 05/20/2013 0945   ALKPHOS 56 07/27/2010 0937   AST 25 05/20/2013 0945   AST 24 07/27/2010 0937   ALT 18 05/20/2013 0945   ALT 14 07/27/2010 0937   BILITOT 0.45 05/20/2013 0945   BILITOT 0.6 07/27/2010 0937      Impression  and Plan:  This is an 44 -year-old female with the following issues:   1. Myelodysplastic syndrome.  Hemoglobin continues to improve despite no intervention for the time being. Patient has not required Aranesp since May 2013. She certainly does not require any intervention. 2. Anemia.  Again, she does have myelodysplastic syndrome as well as an element of B-cell lymphoproliferative disorder. Her hemoglobin have been excellent and had not required any intervention in the last 2 years. 3. Thrombocytopenia.  Platelet count is improving.  4. Neutropenia.  Her absolute neutrophil count is above 1000. No intervention is needed. 5. Follow-up. In 02/2014.   Misty Dunn 5/20/201510:23 AM

## 2013-09-10 NOTE — Telephone Encounter (Signed)
gv adn printed appt sched and avs for pt for NOV °

## 2014-02-20 ENCOUNTER — Telehealth: Payer: Self-pay | Admitting: Oncology

## 2014-02-20 NOTE — Telephone Encounter (Signed)
Called pt's number to adivse appt chg from 11/18 to 11/20 but phone is disconnected. Called pt's sister Paramedic (emergency contact) and lvm advising appt chg. Gave new appt 11/20 @ 10.15.

## 2014-02-23 ENCOUNTER — Telehealth: Payer: Self-pay | Admitting: Oncology

## 2014-02-23 NOTE — Telephone Encounter (Signed)
Pt's sister Arline called states she recd call r/s 11/20 due to she can't bring pt r/s to 11/27 w/MD Arline confirmed labs/ov .Marland Kitchen... KJ

## 2014-03-11 ENCOUNTER — Other Ambulatory Visit: Payer: Medicare Other

## 2014-03-11 ENCOUNTER — Ambulatory Visit: Payer: Medicare Other | Admitting: Oncology

## 2014-03-13 ENCOUNTER — Other Ambulatory Visit: Payer: Medicare Other

## 2014-03-13 ENCOUNTER — Ambulatory Visit: Payer: Medicare Other | Admitting: Oncology

## 2014-03-20 ENCOUNTER — Other Ambulatory Visit (HOSPITAL_BASED_OUTPATIENT_CLINIC_OR_DEPARTMENT_OTHER): Payer: Medicare Other

## 2014-03-20 ENCOUNTER — Telehealth: Payer: Self-pay | Admitting: Oncology

## 2014-03-20 ENCOUNTER — Ambulatory Visit (HOSPITAL_BASED_OUTPATIENT_CLINIC_OR_DEPARTMENT_OTHER): Payer: Medicare Other | Admitting: Oncology

## 2014-03-20 VITALS — BP 141/54 | HR 72 | Temp 97.8°F | Resp 16 | Ht 59.0 in | Wt 99.0 lb

## 2014-03-20 DIAGNOSIS — D696 Thrombocytopenia, unspecified: Secondary | ICD-10-CM

## 2014-03-20 DIAGNOSIS — D709 Neutropenia, unspecified: Secondary | ICD-10-CM

## 2014-03-20 DIAGNOSIS — D469 Myelodysplastic syndrome, unspecified: Secondary | ICD-10-CM

## 2014-03-20 LAB — COMPREHENSIVE METABOLIC PANEL (CC13)
ALBUMIN: 3.5 g/dL (ref 3.5–5.0)
ALK PHOS: 49 U/L (ref 40–150)
ALT: 15 U/L (ref 0–55)
AST: 25 U/L (ref 5–34)
Anion Gap: 11 mEq/L (ref 3–11)
BUN: 21.2 mg/dL (ref 7.0–26.0)
CO2: 26 mEq/L (ref 22–29)
Calcium: 9.3 mg/dL (ref 8.4–10.4)
Chloride: 104 mEq/L (ref 98–109)
Creatinine: 0.8 mg/dL (ref 0.6–1.1)
Glucose: 97 mg/dl (ref 70–140)
POTASSIUM: 4.6 meq/L (ref 3.5–5.1)
Sodium: 140 mEq/L (ref 136–145)
Total Bilirubin: 0.36 mg/dL (ref 0.20–1.20)
Total Protein: 6.5 g/dL (ref 6.4–8.3)

## 2014-03-20 LAB — CBC WITH DIFFERENTIAL/PLATELET
BASO%: 0.2 % (ref 0.0–2.0)
BASOS ABS: 0 10*3/uL (ref 0.0–0.1)
EOS ABS: 0.1 10*3/uL (ref 0.0–0.5)
EOS%: 1 % (ref 0.0–7.0)
HCT: 33.7 % — ABNORMAL LOW (ref 34.8–46.6)
HEMOGLOBIN: 11.2 g/dL — AB (ref 11.6–15.9)
LYMPH%: 26.4 % (ref 14.0–49.7)
MCH: 36.1 pg — ABNORMAL HIGH (ref 25.1–34.0)
MCHC: 33.2 g/dL (ref 31.5–36.0)
MCV: 108.7 fL — AB (ref 79.5–101.0)
MONO#: 0.7 10*3/uL (ref 0.1–0.9)
MONO%: 13.5 % (ref 0.0–14.0)
NEUT#: 3 10*3/uL (ref 1.5–6.5)
NEUT%: 58.9 % (ref 38.4–76.8)
PLATELETS: 107 10*3/uL — AB (ref 145–400)
RBC: 3.1 10*6/uL — AB (ref 3.70–5.45)
RDW: 12.6 % (ref 11.2–14.5)
WBC: 5 10*3/uL (ref 3.9–10.3)
lymph#: 1.3 10*3/uL (ref 0.9–3.3)

## 2014-03-20 NOTE — Telephone Encounter (Signed)
gv adn printed appt sched and avs for pt for May 2016 °

## 2014-03-20 NOTE — Progress Notes (Signed)
Hematology and Oncology Follow Up Visit  Misty Dunn [id number] Nov 20, 1921 80 y.o. 03/20/2014 1:39 PM  CC: Misty Dunn, M.D.    Principle Diagnosis:This is an 89-year female with myelodysplastic syndrome.  She also has an element of B-cell lymphoproliferative disorder diagnosed in the early part of 2012.  Prior Therapy: She received a course rituximab in March 2012 given in [place] with moderate response.  She was on  Aranesp 300 mcg subcu every 4 weeks to keep her hemoglobin above 10, also was supportive transfusions as needed. Last Aransep given in 08/2011.   Current therapy: Observation and surveillance.  Interim History:  Misty Dunn presents today for an office followup visit with her sister. Since the last visit, she reports no new complaints. She has not reported any bleeding or excessive fatigue. She has a good energy level and performance status.  She is not reporting any recent illnesses, any new medications or recent hospitalizations.  She is able to get out and about and perform her activities of living independently without any hindrance or decline.  She has not reported any obvious bleeding such as epistaxis, gum bleeding, melena, hematochezia, hematuria or any type of hemoptysis.  She has not reported any headaches blurred vision or double vision. Has not reported any falls or syncope. She had not reported any chest pain orthopnea or PND. Has not reported any skin rashes or lesions. Rest or view of system is unremarkable.  Medications: I have reviewed the patient's current medications. Unchanged by my review. Current Outpatient Prescriptions  Medication Sig Dispense Refill  . alendronate (FOSAMAX) 70 MG tablet     . brinzolamide (AZOPT) 1 % ophthalmic suspension Place 1 drop into both eyes 2 (two) times daily.      . Calcium Carbonate-Vit D-Min (CALTRATE PLUS PO) Take 1 tablet by mouth daily.      . dorzolamide-timolol (COSOPT) 22.3-6.8 MG/ML ophthalmic solution Apply  1 drop to eye daily. Both eyes    . latanoprost (XALATAN) 0.005 % ophthalmic solution Apply 1 drop to eye daily. Both eyes    . Multiple Vitamin (MULTIVITAMIN) tablet Take 1 tablet by mouth daily.       No current facility-administered medications for this visit.    Allergies:  Allergies  Allergen Reactions  . Codeine     Past Medical History, Surgical history, Social history, and Family History were reviewed and updated.   Physical Exam: Blood pressure 141/54, pulse 72, temperature 97.8 F (36.6 C), temperature source Oral, resp. rate 16, height 4\' 11"  (1.499 m), weight 99 lb (44.906 kg). ECOG: 1 General appearance: alert awake not in any distress the Head: Normocephalic, without obvious abnormality, atraumatic Neck: no adenopathy, no masses. Lymph nodes: Cervical, supraclavicular, and axillary nodes normal. Heart:regular rate and rhythm, S1, S2 normal, no murmur, click, rub or gallop Lung:chest clear, no wheezing, rales, normal symmetric air entry.  Abdomen: soft, non-tender, without masses or organomegaly EXT:no erythema, induration, or nodules   Lab Results: Lab Results  Component Value Date   WBC 5.0 03/20/2014   HGB 11.2* 03/20/2014   HCT 33.7* 03/20/2014   MCV 108.7* 03/20/2014   PLT 107* 03/20/2014     Chemistry      Component Value Date/Time   NA 143 09/10/2013 0953   NA 139 07/27/2010 0937   K 4.4 09/10/2013 0953   K 4.2 07/27/2010 0937   CL 106 07/27/2010 0937   CO2 27 09/10/2013 0953   CO2 26 07/27/2010 0937   BUN 19.1  09/10/2013 0953   BUN 20 07/27/2010 0937   CREATININE 0.8 09/10/2013 0953   CREATININE 0.88 07/27/2010 0937      Component Value Date/Time   CALCIUM 9.1 09/10/2013 0953   CALCIUM 8.7 07/27/2010 0937   ALKPHOS 58 09/10/2013 0953   ALKPHOS 56 07/27/2010 0937   AST 27 09/10/2013 0953   AST 24 07/27/2010 0937   ALT 18 09/10/2013 0953   ALT 14 07/27/2010 0937   BILITOT 0.47 09/10/2013 0953   BILITOT 0.6 07/27/2010 0937       Impression and Plan:  This is an 31 -year-old female with the following issues:   1. Myelodysplastic syndrome.  Hemoglobin close to normal at this time without any intervention. I see no need for any transfusion or growth factor support for the time being. We will continue to follow her closely. 2. Thrombocytopenia.  Platelet count continues to be stable as well. 3. Neutropenia.  Her absolute neutrophil count is above 1000. No intervention is needed. 4. Follow-up. In 6 months to recheck her counts.   Misty Dunn 11/27/20151:39 PM

## 2014-07-10 ENCOUNTER — Encounter (HOSPITAL_COMMUNITY): Payer: Self-pay | Admitting: Emergency Medicine

## 2014-07-10 ENCOUNTER — Emergency Department (HOSPITAL_COMMUNITY): Payer: Medicare Other

## 2014-07-10 ENCOUNTER — Inpatient Hospital Stay (HOSPITAL_COMMUNITY)
Admission: EM | Admit: 2014-07-10 | Discharge: 2014-07-11 | DRG: 605 | Disposition: A | Discharge status: Home Health | Payer: Medicare Other | Attending: Emergency Medicine | Admitting: Emergency Medicine

## 2014-07-10 ENCOUNTER — Encounter (HOSPITAL_COMMUNITY): Payer: Self-pay

## 2014-07-10 ENCOUNTER — Emergency Department (HOSPITAL_COMMUNITY)
Admission: EM | Admit: 2014-07-10 | Discharge: 2014-07-10 | Disposition: A | Discharge status: Home / Self Care | Payer: Medicare Other | Source: Home / Self Care | Attending: Emergency Medicine | Admitting: Emergency Medicine

## 2014-07-10 DIAGNOSIS — Y92091 Bathroom in other non-institutional residence as the place of occurrence of the external cause: Secondary | ICD-10-CM | POA: Diagnosis not present

## 2014-07-10 DIAGNOSIS — I951 Orthostatic hypotension: Secondary | ICD-10-CM | POA: Diagnosis not present

## 2014-07-10 DIAGNOSIS — R2681 Unsteadiness on feet: Secondary | ICD-10-CM | POA: Diagnosis not present

## 2014-07-10 DIAGNOSIS — Z8619 Personal history of other infectious and parasitic diseases: Secondary | ICD-10-CM

## 2014-07-10 DIAGNOSIS — M810 Age-related osteoporosis without current pathological fracture: Secondary | ICD-10-CM | POA: Diagnosis present

## 2014-07-10 DIAGNOSIS — W19XXXA Unspecified fall, initial encounter: Secondary | ICD-10-CM | POA: Diagnosis not present

## 2014-07-10 DIAGNOSIS — W19XXXD Unspecified fall, subsequent encounter: Secondary | ICD-10-CM

## 2014-07-10 DIAGNOSIS — Z9181 History of falling: Secondary | ICD-10-CM

## 2014-07-10 DIAGNOSIS — Z79899 Other long term (current) drug therapy: Secondary | ICD-10-CM

## 2014-07-10 DIAGNOSIS — W1812XA Fall from or off toilet with subsequent striking against object, initial encounter: Secondary | ICD-10-CM

## 2014-07-10 DIAGNOSIS — F0390 Unspecified dementia without behavioral disturbance: Secondary | ICD-10-CM | POA: Diagnosis present

## 2014-07-10 DIAGNOSIS — S0990XA Unspecified injury of head, initial encounter: Secondary | ICD-10-CM | POA: Diagnosis present

## 2014-07-10 DIAGNOSIS — S0003XA Contusion of scalp, initial encounter: Secondary | ICD-10-CM | POA: Diagnosis present

## 2014-07-10 DIAGNOSIS — H409 Unspecified glaucoma: Secondary | ICD-10-CM | POA: Diagnosis present

## 2014-07-10 DIAGNOSIS — Z8579 Personal history of other malignant neoplasms of lymphoid, hematopoietic and related tissues: Secondary | ICD-10-CM

## 2014-07-10 DIAGNOSIS — S0083XA Contusion of other part of head, initial encounter: Principal | ICD-10-CM | POA: Diagnosis present

## 2014-07-10 DIAGNOSIS — D469 Myelodysplastic syndrome, unspecified: Secondary | ICD-10-CM | POA: Diagnosis present

## 2014-07-10 DIAGNOSIS — Z885 Allergy status to narcotic agent status: Secondary | ICD-10-CM

## 2014-07-10 DIAGNOSIS — R262 Difficulty in walking, not elsewhere classified: Secondary | ICD-10-CM | POA: Diagnosis not present

## 2014-07-10 LAB — CBC WITH DIFFERENTIAL/PLATELET
Basophils Absolute: 0 10*3/uL (ref 0.0–0.1)
Basophils Relative: 0 % (ref 0–1)
EOS ABS: 0 10*3/uL (ref 0.0–0.7)
EOS PCT: 0 % (ref 0–5)
HCT: 35.6 % — ABNORMAL LOW (ref 36.0–46.0)
Hemoglobin: 12 g/dL (ref 12.0–15.0)
LYMPHS ABS: 0.3 10*3/uL — AB (ref 0.7–4.0)
Lymphocytes Relative: 6 % — ABNORMAL LOW (ref 12–46)
MCH: 36.3 pg — AB (ref 26.0–34.0)
MCHC: 33.7 g/dL (ref 30.0–36.0)
MCV: 107.6 fL — ABNORMAL HIGH (ref 78.0–100.0)
MONO ABS: 0.3 10*3/uL (ref 0.1–1.0)
Monocytes Relative: 6 % (ref 3–12)
Neutro Abs: 5 10*3/uL (ref 1.7–7.7)
Neutrophils Relative %: 88 % — ABNORMAL HIGH (ref 43–77)
PLATELETS: 90 10*3/uL — AB (ref 150–400)
RBC: 3.31 MIL/uL — ABNORMAL LOW (ref 3.87–5.11)
RDW: 13.1 % (ref 11.5–15.5)
WBC: 5.6 10*3/uL (ref 4.0–10.5)

## 2014-07-10 LAB — BASIC METABOLIC PANEL
Anion gap: 8 (ref 5–15)
BUN: 25 mg/dL — AB (ref 6–23)
CO2: 27 mmol/L (ref 19–32)
Calcium: 8.5 mg/dL (ref 8.4–10.5)
Chloride: 105 mmol/L (ref 96–112)
Creatinine, Ser: 0.81 mg/dL (ref 0.50–1.10)
GFR calc Af Amer: 72 mL/min — ABNORMAL LOW (ref >90)
GFR calc non Af Amer: 62 mL/min — ABNORMAL LOW (ref >90)
GLUCOSE: 116 mg/dL — AB (ref 70–99)
POTASSIUM: 3.7 mmol/L (ref 3.5–5.1)
Sodium: 140 mmol/L (ref 135–145)

## 2014-07-10 LAB — I-STAT TROPONIN, ED: Troponin i, poc: 0.01 ng/mL (ref 0.00–0.08)

## 2014-07-10 MED ORDER — ONDANSETRON 4 MG PO TBDP
4.0000 mg | ORAL_TABLET | Freq: Once | ORAL | Status: AC
  Administered 2014-07-10: 4 mg via ORAL
  Filled 2014-07-10: qty 1

## 2014-07-10 MED ORDER — VITAMIN D3 25 MCG (1000 UNIT) PO TABS
1000.0000 [IU] | ORAL_TABLET | Freq: Every day | ORAL | Status: DC
  Administered 2014-07-11: 1000 [IU] via ORAL
  Filled 2014-07-10: qty 1

## 2014-07-10 NOTE — Care Management (Signed)
ED CM consulted to met with patient concerning [facility] services recommendation. Patient lives at [facility].  Patient presents today s/p fall, and c/o generalized weakness. Patient ambulates with a cane at the facility.  ED evaluation noted no significant findings however, ED RN attempted to ambulate patient, she became increasingly weak and began to fall.  Discussed with Sherlene Shams and L. [facility]ws [place] patient daughter lives out of town and will arrive tomorrow. Patient does not have anyone to assist her tonight. DME needs have been identified, rolling walker/ 3 n 1 chair for safe discharge. CM will continue to follow for safe disposition plan.

## 2014-07-10 NOTE — H&P (Signed)
[facility] Admission History and Physical       Misty Dunn VZD:[id number] DOB: [birth year] DOA: 07/10/2014  Referring physician: EDP PCP: Mathews Argyle, MD  Specialists:   Chief Complaint: Weakness  HPI: Misty Dunn is a 81 y.o. female with history of Myelodysplastic Syndrome, Osteoporosis, Glaucoma who presents to the ED with complaints of increased weakness especially in her legs and difficulty walker.  She suffered a fall 1 day ago and was seen in the ED at [facility] long and evaluated and found to have a Left Frontal scalp hematoma, but in the afternoon, she was sent back to the ED due to increased weakness.  She currently lives in the [place] section of [facility], and Case Management has been consulted for assistance with placing her in Assisted Living or SNF.  Review of Systems:   Constitutional: No Weight Loss, No Weight Gain, Night Sweats, Fevers, Chills, Dizziness, Light Headedness, Fatigue, +Generalized Weakness HEENT: No Headaches, Difficulty Swallowing,Tooth/Dental Problems,Sore Throat,  No Sneezing, Rhinitis, Ear Ache, Nasal Congestion, or Post Nasal Drip,  Cardio-vascular:  No Chest pain, Orthopnea, PND, Edema in Lower Extremities, Anasarca, Dizziness, Palpitations  Resp: No Dyspnea, No DOE, No Productive Cough, No Non-Productive Cough, No Hemoptysis, No Wheezing.    GI: No Heartburn, Indigestion, Abdominal Pain, Nausea, Vomiting, Diarrhea, Constipation, Hematemesis, Hematochezia, Melena, Change in Bowel Habits,  Loss of Appetite  GU: No Dysuria, No Change in Color of Urine, No Urgency or Urinary Frequency, No Flank pain.  Musculoskeletal: No Joint Pain or Swelling, No Decreased Range of Motion, No Back Pain.  Neurologic: No Syncope, No Seizures, Muscle Weakness, Paresthesia, Vision Disturbance or Loss, No Diplopia, No Vertigo, No Difficulty Walking,  Skin: No Rash or Lesions. Psych: No Change in Mood or Affect, No Depression or  Anxiety, No Memory loss, No Confusion, or Hallucinations   Past Medical History  Diagnosis Date  . Glaucoma   . Osteoporosis   . Herpes zoster infection   . MDS (myelodysplastic syndrome)     B-cell lymphoproliferative disorder     History reviewed. No pertinent past surgical history.    Prior to Admission medications   Medication Sig Start Date End Date Taking? Authorizing Provider  Calcium Carbonate-Vit D-Min (CALTRATE PLUS PO) Take 1 tablet by mouth daily.     Yes Historical Provider, MD  dorzolamide-timolol (COSOPT) 22.3-6.8 MG/ML ophthalmic solution Place 1 drop into both eyes 2 (two) times daily.  08/27/12  Yes Historical Provider, MD  latanoprost (XALATAN) 0.005 % ophthalmic solution Place 1 drop into both eyes daily.  08/20/12  Yes Historical Provider, MD     Allergies  Allergen Reactions  . Codeine Other (See Comments)    hallucinations    Social History:  reports that she has never smoked. She has never used smokeless tobacco. She reports that she drinks alcohol. She reports that she does not use illicit drugs.    History reviewed. No pertinent family history.     Physical Exam:  GEN:  Pleasant Elderly  81 y.o. Caucasian female examined and in no acute distress; cooperative with exam Filed Vitals:   07/10/14 2054 07/10/14 2200 07/10/14 2215 07/10/14 2230  BP: 117/44 126/46 124/41 126/43  Pulse: 71 70 71 67  Temp:      TempSrc:      Resp: 18 22 20 17   SpO2: 96% 100% 96% 95%   Blood pressure 126/43, pulse 67, temperature 98.3 F (36.8 C), temperature source Oral, resp. rate 17, SpO2 95 %. PSYCH: She  is alert and oriented x4; does not appear anxious does not appear depressed; affect is normal HEENT: Normocephalic and Atraumatic, Mucous membranes pink; PERRLA; EOM intact; Fundi:  Benign;  No scleral icterus, Nares: Patent, Oropharynx: Clear, Fair Dentition,    Neck:  FROM, No Cervical Lymphadenopathy nor Thyromegaly or Carotid Bruit; No JVD; Breasts:: Not  examined CHEST WALL: No tenderness CHEST: Normal respiration, clear to auscultation bilaterally HEART: Regular rate and rhythm; no murmurs rubs or gallops BACK: No kyphosis or scoliosis; No CVA tenderness ABDOMEN: Positive Bowel Sounds, Soft Non-Tender, No Rebound or Guarding; No Masses, No Organomegaly Rectal Exam: Not done EXTREMITIES: No Cyanosis, Clubbing, or Edema; No Ulcerations. Genitalia: not examined PULSES: 2+ and symmetric SKIN: Normal hydration no rash or ulceration CNS:  Alert and Oriented x 4, No Focal Deficits Vascular: pulses palpable throughout    Labs on Admission:  Basic Metabolic Panel:  Recent Labs Lab 07/10/14 1808  NA 140  K 3.7  CL 105  CO2 27  GLUCOSE 116*  BUN 25*  CREATININE 0.81  CALCIUM 8.5   Liver Function Tests: No results for input(s): AST, ALT, ALKPHOS, BILITOT, PROT, ALBUMIN in the last 168 hours. No results for input(s): LIPASE, AMYLASE in the last 168 hours. No results for input(s): AMMONIA in the last 168 hours. CBC:  Recent Labs Lab 07/10/14 1808  WBC 5.6  NEUTROABS 5.0  HGB 12.0  HCT 35.6*  MCV 107.6*  PLT 90*   Cardiac Enzymes: No results for input(s): CKTOTAL, CKMB, CKMBINDEX, TROPONINI in the last 168 hours.  BNP (last 3 results) No results for input(s): BNP in the last 8760 hours.  ProBNP (last 3 results) No results for input(s): PROBNP in the last 8760 hours.  CBG: No results for input(s): GLUCAP in the last 168 hours.  Radiological Exams on Admission: Ct Head Wo Contrast  07/10/2014   CLINICAL DATA:  Nausea and vomiting following fall 1 day prior  EXAM: CT HEAD WITHOUT CONTRAST  TECHNIQUE: Contiguous axial images were obtained from the base of the skull through the vertex without intravenous contrast.  COMPARISON:  None.  FINDINGS: There is moderate diffuse atrophy. There is no intracranial mass, hemorrhage, extra-axial fluid collection, or midline shift. There is small vessel disease throughout the centra  semiovale bilaterally. Small vessel disease is also noted in the internal and external capsules bilaterally. No acute infarct apparent.  There is a left frontal scalp hematoma. Bones are osteoporotic. No fractures are seen. There is a small exostosis adjacent to the mastoid air cells on the right laterally. Mastoid air cells are clear. Cataracts are noted bilaterally.  IMPRESSION: Moderate atrophy with small vessel disease throughout the periventricular white matter as well as in both internal and external capsules. No acute infarct apparent. No intracranial mass, hemorrhage, or extra-axial fluid collection. Left frontal scalp hematoma. Bones osteoporotic.   Electronically Signed   By: Lowella Grip III M.D.   On: 07/10/2014 08:32   Mr Brain Wo Contrast  07/10/2014   CLINICAL DATA:  Slurred speech and gait difficulty. Recent fall. Syncopal episode with loss of consciousness.  EXAM: MRI HEAD WITHOUT CONTRAST  TECHNIQUE: Multiplanar, multiecho pulse sequences of the brain and surrounding structures were obtained without intravenous contrast.  COMPARISON:  CT head earlier today.  FINDINGS: No evidence for acute infarction, hemorrhage, mass lesion, hydrocephalus, or extra-axial fluid. Cerebral and cerebellar atrophy of a moderately advanced degree, not unexpected for age. Extensive T2 and FLAIR hyperintensity throughout periventricular and subcortical white matter representing chronic microvascular  ischemic change.  Gradient sequence is abnormal, demonstrating numerous subcentimeter areas of susceptibility throughout the cerebral cortex, as well as subcortical and periventricular white matter. No T1 shortening associated with any lesion. No increased FLAIR signal in the subarachnoid space. No increased attenuation on CT to suggest acute shearing injury. The findings are most consistent with sequelae of chronic hypertensive cerebral vascular disease. Cerebral amyloid angiopathy is less favored.  Flow voids are  maintained. BILATERAL cataract extraction. No sinus air-fluid level. Negative mastoids. Extracranial soft tissues unremarkable including the upper cervical region.  IMPRESSION: No acute intracranial findings. No stroke or visible extra-axial collection.  Atrophy and small vessel disease.  Widespread areas of microhemorrhage throughout the cerebral hemispheres, likely sequelae of hypertensive cerebrovascular disease.   Electronically Signed   By: Rolla Flatten M.D.   On: 07/10/2014 20:19   Dg Shoulder Left  07/10/2014   CLINICAL DATA:  Unwitnessed fall.  Hematoma and skin tear  EXAM: LEFT SHOULDER - 2+ VIEW  COMPARISON:  None.  FINDINGS: Glenohumeral joint is intact. No evidence of scapular fracture or humeral fracture. The acromioclavicular joint is intact.  IMPRESSION: No fracture or dislocation.   Electronically Signed   By: Suzy Bouchard M.D.   On: 07/10/2014 18:58     EKG: Independently reviewed.    Assessment/Plan:   81 y.o. female with  Active Problems:   1.   Unsteady gait/Weakness   Fall precautions   Physical Therapy consult    Case Management Consult for higher Acuity Placement     2.   Hematoma of frontal scalp   Ice packs PRN     3.   Fall- Due to Weakness   Fall Precautions       4.   MDS (myelodysplastic syndrome)   Monitor Cell Lines     5.   DVT Prophylaxis    SCDs        Code Status:     FULL CODE      Family Communication:    No Family Present    Disposition Plan:    Inpatient  Status        Time spent:  [facility] Pager [phone]   If [place]ts Please Contact the Day Rounding Team MD for [facility]  If 7PM-7AM, Please Contact Night-Floor Coverage  [email or web address] Password [email or web address] 07/10/2014, 11:14 PM     ADDENDUM:   Patient was seen and examined on 07/10/2014

## 2014-07-10 NOTE — ED Notes (Signed)
A detailed message left on voice mail of Misty Dunn of negative CT head, and that the patient needs to follow up with regular PCP on Monday. Also informed that the patient was returning with brother-in-law in a private vehicle.

## 2014-07-10 NOTE — ED Notes (Signed)
Bed: IW97 Expected date:  Expected time:  Means of arrival:  Comments: EMS 81 yo female from [place] to head

## 2014-07-10 NOTE — ED Notes (Signed)
Lattie Haw, [place] in to see patient

## 2014-07-10 NOTE — ED Provider Notes (Signed)
CSN: [id number]     Arrival date & time 07/10/14  1725 History   First MD Initiated Contact with Patient 07/10/14 1738     Chief Complaint  Patient presents with  . Altered Mental Status  . Neurologic Problem     (Consider location/radiation/quality/duration/timing/severity/associated sxs/prior Treatment) The history is provided by the patient and medical records.   This is a 81 year old female with past medical history significant for glaucoma, osteoporosis, herpes zoster, MDS, presenting to the ED with questionable neurologic changes. Patient was seen at [facility] ED this morning for a fall. She had a CT of her head performed which was negative for acute findings and was discharged back to her independent living facility. Her mental status was at baseline at this time. Family reports that this afternoon she was having changes in her speech and right-sided facial droop, unsure of exact time. Patient denies any recurrent fall.  She denies persistent headache, dizziness, visual disturbance, tinnitus, numbness, weakness, or confusion.  States her only compliant is that she feels a little off balance with walking and she has some left shoulder pain from falling this morning. States she has started walking with a walker.  Patient is not currently on anti-coagulation.  She denies prior hx of TIA or stroke.  No fever, chills, sweats, etc.  Past Medical History  Diagnosis Date  . Glaucoma   . Osteoporosis   . Herpes zoster infection   . MDS (myelodysplastic syndrome)     B-cell lymphoproliferative disorder   History reviewed. No pertinent past surgical history. History reviewed. No pertinent family history. History  Substance Use Topics  . Smoking status: Never Smoker   . Smokeless tobacco: Never Used  . Alcohol Use: Yes     Comment: occasional   OB History    No data available     Review of Systems  Musculoskeletal: Positive for arthralgias.  All other systems reviewed and are  negative.     Allergies  Codeine  Home Medications   Prior to Admission medications   Medication Sig Start Date End Date Taking? Authorizing Provider  brinzolamide (AZOPT) 1 % ophthalmic suspension Place 1 drop into both eyes 2 (two) times daily.      Historical Provider, MD  Calcium Carbonate-Vit D-Min (CALTRATE PLUS PO) Take 1 tablet by mouth daily.      Historical Provider, MD  dorzolamide-timolol (COSOPT) 22.3-6.8 MG/ML ophthalmic solution Place 1 drop into both eyes 2 (two) times daily.  08/27/12   Historical Provider, MD  latanoprost (XALATAN) 0.005 % ophthalmic solution Place 1 drop into both eyes daily.  08/20/12   Historical Provider, MD   BP 129/66 mmHg  Pulse 69  Temp(Src) 98.6 F (37 C) (Oral)  Resp 14  SpO2 97%   Physical Exam  Constitutional: She is oriented to person, place, and time. She appears well-developed and well-nourished.  Thin but not cachectic  HENT:  Head: Normocephalic and atraumatic.  Mouth/Throat: Oropharynx is clear and moist.  Bruising and hematoma to left side of forehead and upper cheek; no bony deformities; mid-face stable; dentition intact  Eyes: Conjunctivae and EOM are normal. Pupils are equal, round, and reactive to light.  EOMs fully intact, no signs of entrapment  Neck: Normal range of motion.  Cardiovascular: Normal rate, regular rhythm and normal heart sounds.   Pulmonary/Chest: Effort normal and breath sounds normal.  Abdominal: Soft. Bowel sounds are normal.  Musculoskeletal: Normal range of motion.  Neurological: She is alert and oriented to person, place,  and time.  AAOx3, answering questions and following commands appropriately; equal strength UE and LE bilaterally; CN grossly intact; moves all extremities appropriately without ataxia; normal coordination, negative pronator drift; no significant facial asymmetry appreciated; speech is clear and goal oriented  Skin: Skin is warm and dry.  Psychiatric: She has a normal mood and  affect.  Nursing note and vitals reviewed.   ED Course  Procedures (including critical care time) Labs Review Labs Reviewed  CBC WITH DIFFERENTIAL/PLATELET - Abnormal; Notable for the following:    RBC 3.31 (*)    HCT 35.6 (*)    MCV 107.6 (*)    MCH 36.3 (*)    Platelets 90 (*)    Neutrophils Relative % 88 (*)    Lymphocytes Relative 6 (*)    Lymphs Abs 0.3 (*)    All other components within normal limits  BASIC METABOLIC PANEL - Abnormal; Notable for the following:    Glucose, Bld 116 (*)    BUN 25 (*)    GFR calc non Af Amer 62 (*)    GFR calc Af Amer 72 (*)    All other components within normal limits  URINALYSIS, ROUTINE W REFLEX MICROSCOPIC  I-STAT TROPOININ, ED    Imaging Review Ct Head Wo Contrast  07/10/2014   CLINICAL DATA:  Nausea and vomiting following fall 1 day prior  EXAM: CT HEAD WITHOUT CONTRAST  TECHNIQUE: Contiguous axial images were obtained from the base of the skull through the vertex without intravenous contrast.  COMPARISON:  None.  FINDINGS: There is moderate diffuse atrophy. There is no intracranial mass, hemorrhage, extra-axial fluid collection, or midline shift. There is small vessel disease throughout the centra semiovale bilaterally. Small vessel disease is also noted in the internal and external capsules bilaterally. No acute infarct apparent.  There is a left frontal scalp hematoma. Bones are osteoporotic. No fractures are seen. There is a small exostosis adjacent to the mastoid air cells on the right laterally. Mastoid air cells are clear. Cataracts are noted bilaterally.  IMPRESSION: Moderate atrophy with small vessel disease throughout the periventricular white matter as well as in both internal and external capsules. No acute infarct apparent. No intracranial mass, hemorrhage, or extra-axial fluid collection. Left frontal scalp hematoma. Bones osteoporotic.   Electronically Signed   By: Lowella Grip III M.D.   On: 07/10/2014 08:32   Mr Brain Wo  Contrast  07/10/2014   CLINICAL DATA:  Slurred speech and gait difficulty. Recent fall. Syncopal episode with loss of consciousness.  EXAM: MRI HEAD WITHOUT CONTRAST  TECHNIQUE: Multiplanar, multiecho pulse sequences of the brain and surrounding structures were obtained without intravenous contrast.  COMPARISON:  CT head earlier today.  FINDINGS: No evidence for acute infarction, hemorrhage, mass lesion, hydrocephalus, or extra-axial fluid. Cerebral and cerebellar atrophy of a moderately advanced degree, not unexpected for age. Extensive T2 and FLAIR hyperintensity throughout periventricular and subcortical white matter representing chronic microvascular ischemic change.  Gradient sequence is abnormal, demonstrating numerous subcentimeter areas of susceptibility throughout the cerebral cortex, as well as subcortical and periventricular white matter. No T1 shortening associated with any lesion. No increased FLAIR signal in the subarachnoid space. No increased attenuation on CT to suggest acute shearing injury. The findings are most consistent with sequelae of chronic hypertensive cerebral vascular disease. Cerebral amyloid angiopathy is less favored.  Flow voids are maintained. BILATERAL cataract extraction. No sinus air-fluid level. Negative mastoids. Extracranial soft tissues unremarkable including the upper cervical region.  IMPRESSION: No acute intracranial  findings. No stroke or visible extra-axial collection.  Atrophy and small vessel disease.  Widespread areas of microhemorrhage throughout the cerebral hemispheres, likely sequelae of hypertensive cerebrovascular disease.   Electronically Signed   By: Rolla Flatten M.D.   On: 07/10/2014 20:19   Dg Shoulder Left  07/10/2014   CLINICAL DATA:  Unwitnessed fall.  Hematoma and skin tear  EXAM: LEFT SHOULDER - 2+ VIEW  COMPARISON:  None.  FINDINGS: Glenohumeral joint is intact. No evidence of scapular fracture or humeral fracture. The acromioclavicular joint is  intact.  IMPRESSION: No fracture or dislocation.   Electronically Signed   By: Suzy Bouchard M.D.   On: 07/10/2014 18:58     EKG Interpretation None      MDM   Final diagnoses:  Fall, subsequent encounter  Unsteady gait   81 year old female with fall earlier this morning. She was evaluated in the ED with a negative head CT that time. Her neurologic exam remained normal and she was discharged back to her facility. Patient's family members thought that during the afternoon her speech appeared somewhat slurred with a slight right-sided facial droop. On arrival to ED, patient is without focal neurologic deficits. She does admit to an intermittently unsteady gait, she has started ambulating with a walker. Will obtain basic labs and MRI for further evaluation.  Labwork is reassuring. MRI is negative for acute stroke. Shoulder films also negative for fracture or dislocation. Attempted to ambulate patient in the ED, she is too unsteady to walk even with 1 person assist. She cannot be discharged back to her independent living facility at this time because of this. Case was discussed with hospitalist, Dr. Arnoldo Morale who will admit for further management. U/a pending. Temp admission orders placed.  Mariann Laster with case management also evaluated patient and will help with home health as patient refuses ALF/SNF placement at this time.  Patient's POA is driving into town tonight, will be here tomorrow to discuss patient's care.  Larene Pickett, PA-C 07/10/14 2307  Tanna Furry, MD 07/11/14 2218

## 2014-07-10 NOTE — ED Notes (Signed)
Spoke with POA daughter Gwenette Greet.  Daughter states she feels like this isn't a "safe" discharge because we cannot guarantee that her mother will be 100% able to take care of herself in independent living.  Contacted social worker Jody to speak with daughter and possibly refer for home health aide or private duty nursing.  Daughter is located in [place].

## 2014-07-10 NOTE — ED Notes (Signed)
Pt's daughter, Acquanetta Sit, is on the phone claiming to be the pt's POA. This RN is unable to verify this information, as the pt is in MRI, and there is no family at the bedside. Pt's daughter told that if she can fax POA paperwork, or the pt can give this RN permission to speak with her, that this RN can give her information.

## 2014-07-10 NOTE — ED Notes (Signed)
This RN received the pt's POA paperwork, naming the pt's daughter Acquanetta Sit the pt's POA. This information was verified with the pt's family at the bedside. This RN called the pt's POA, who lives in [place], and updated her on the pt's status and plan of care.

## 2014-07-10 NOTE — ED Notes (Signed)
This RN attempted to get the pt up and walk her, but the pt was unable to stand up straight. The pt leaned backwards, and if this RN had stopped assisting pt, the pt would have fallen backwards. Mariann Laster, RN to see pt to attempt to set up home health.

## 2014-07-10 NOTE — Progress Notes (Signed)
Called ED to get report. Nurse stated she would call for report in a few minutes. Ranelle Oyster, RN

## 2014-07-10 NOTE — ED Notes (Signed)
Pt's family states that she told them that she was having pain "all over", but told this RN that she is pain free.

## 2014-07-10 NOTE — ED Notes (Signed)
Pt comes from [facility] via EMS. Pt has unwitnessed fall last night getting out of bad. Denies LOC. Pt has hematoma with skin tear to left side of forehead. Pt has n/v after falling.

## 2014-07-10 NOTE — ED Notes (Signed)
Pt arrived by EMS, after being seen at [facility] today after fall at [place] left shoulder pain 5/10.  EMS reports that family noticed neuro changes but is unaware of the onset time.  EMS reports family stated speech is "thicker", is usually A+O at baseline patient stated this month as being November, also natural smile has right sided facial droop on arrival.

## 2014-07-10 NOTE — Discharge Instructions (Signed)
Hematoma A hematoma is a collection of blood under the skin, in an organ, in a body space, in a joint space, or in other tissue. The blood can clot to form a lump that you can see and feel. The lump is often firm and may sometimes become sore and tender. Most hematomas get better in a few days to weeks. However, some hematomas may be serious and require medical care. Hematomas can range in size from very small to very large. CAUSES  A hematoma can be caused by a blunt or penetrating injury. It can also be caused by spontaneous leakage from a blood vessel under the skin. Spontaneous leakage from a blood vessel is more likely to occur in older people, especially those taking blood thinners. Sometimes, a hematoma can develop after certain medical procedures. SIGNS AND SYMPTOMS   A firm lump on the body.  Possible pain and tenderness in the area.  Bruising.Blue, dark blue, purple-red, or yellowish skin may appear at the site of the hematoma if the hematoma is close to the surface of the skin. For hematomas in deeper tissues or body spaces, the signs and symptoms may be subtle. For example, an intra-abdominal hematoma may cause abdominal pain, weakness, fainting, and shortness of breath. An intracranial hematoma may cause a headache or symptoms such as weakness, trouble speaking, or a change in consciousness. DIAGNOSIS  A hematoma can usually be diagnosed based on your medical history and a physical exam. Imaging tests may be needed if your health care provider suspects a hematoma in deeper tissues or body spaces, such as the abdomen, head, or chest. These tests may include ultrasonography or a CT scan.  TREATMENT  Hematomas usually go away on their own over time. Rarely does the blood need to be drained out of the body. Large hematomas or those that may affect vital organs will sometimes need surgical drainage or monitoring. HOME CARE INSTRUCTIONS   Apply ice to the injured area:   Put ice in a  plastic bag.   Place a towel between your skin and the bag.   Leave the ice on for 20 minutes, 2-3 times a day for the first 1 to 2 days.   After the first 2 days, switch to using warm compresses on the hematoma.   Elevate the injured area to help decrease pain and swelling. Wrapping the area with an elastic bandage may also be helpful. Compression helps to reduce swelling and promotes shrinking of the hematoma. Make sure the bandage is not wrapped too tight.   If your hematoma is on a lower extremity and is painful, crutches may be helpful for a couple days.   Only take over-the-counter or prescription medicines as directed by your health care provider. SEEK IMMEDIATE MEDICAL CARE IF:   You have increasing pain, or your pain is not controlled with medicine.   You have a fever.   You have worsening swelling or discoloration.   Your skin over the hematoma breaks or starts bleeding.   Your hematoma is in your chest or abdomen and you have weakness, shortness of breath, or a change in consciousness.  Your hematoma is on your scalp (caused by a fall or injury) and you have a worsening headache or a change in alertness or consciousness. MAKE SURE YOU:   Understand these instructions.  Will watch your condition.  Will get help right away if you are not doing well or get worse. Document Released: 11/23/2003 Document Revised: 12/11/2012 Document Reviewed: 09/18/2012   [employer] Patient Information 2015 [place]. This information is not intended to replace advice given to you by your health care provider. Make sure you discuss any questions you have with your health care provider. Head Injury You have received a head injury. It does not appear serious at this time. Headaches and vomiting are common following head injury. It should be easy to awaken from sleeping. Sometimes it is necessary for you to stay in the emergency department for a while for observation. Sometimes  admission to the hospital may be needed. After injuries such as yours, most problems occur within the first 24 hours, but side effects may occur up to 7-10 days after the injury. It is important for you to carefully monitor your condition and contact your health care provider or seek immediate medical care if there is a change in your condition. WHAT ARE THE TYPES OF HEAD INJURIES? Head injuries can be as minor as a bump. Some head injuries can be more severe. More severe head injuries include:  A jarring injury to the brain (concussion).  A bruise of the brain (contusion). This mean there is bleeding in the brain that can cause swelling.  A cracked skull (skull fracture).  Bleeding in the brain that collects, clots, and forms a bump (hematoma). WHAT CAUSES A HEAD INJURY? A serious head injury is most likely to happen to someone who is in a car wreck and is not wearing a seat belt. Other causes of major head injuries include bicycle or motorcycle accidents, sports injuries, and falls. HOW ARE HEAD INJURIES DIAGNOSED? A complete history of the event leading to the injury and your current symptoms will be helpful in diagnosing head injuries. Many times, pictures of the brain, such as CT or MRI are needed to see the extent of the injury. Often, an overnight hospital stay is necessary for observation.  WHEN SHOULD I SEEK IMMEDIATE MEDICAL CARE?  You should get help right away if:  You have confusion or drowsiness.  You feel sick to your stomach (nauseous) or have continued, forceful vomiting.  You have dizziness or unsteadiness that is getting worse.  You have severe, continued headaches not relieved by medicine. Only take over-the-counter or prescription medicines for pain, fever, or discomfort as directed by your health care provider.  You do not have normal function of the arms or legs or are unable to walk.  You notice changes in the black spots in the center of the colored part of your  eye (pupil).  You have a clear or bloody fluid coming from your nose or ears.  You have a loss of vision. During the next 24 hours after the injury, you must stay with someone who can watch you for the warning signs. This person should contact local emergency services (911 in the [place] if you have seizures, you become unconscious, or you are unable to wake up. HOW CAN I PREVENT A HEAD INJURY IN THE FUTURE? The most important factor for preventing major head injuries is avoiding motor vehicle accidents. To minimize the potential for damage to your head, it is crucial to wear seat belts while riding in motor vehicles. Wearing helmets while bike riding and playing collision sports (like football) is also helpful. Also, avoiding dangerous activities around the house will further help reduce your risk of head injury.  WHEN CAN I RETURN TO NORMAL ACTIVITIES AND ATHLETICS? You should be reevaluated by your health care provider before returning to these activities. If you have any  of the following symptoms, you should not return to activities or contact sports until 1 week after the symptoms have stopped:  Persistent headache.  Dizziness or vertigo.  Poor attention and concentration.  Confusion.  Memory problems.  Nausea or vomiting.  Fatigue or tire easily.  Irritability.  Intolerant of bright lights or loud noises.  Anxiety or depression.  Disturbed sleep. MAKE SURE YOU:   Understand these instructions.  Will watch your condition.  Will get help right away if you are not doing well or get worse. Document Released: 04/10/2005 Document Revised: 04/15/2013 Document Reviewed: 12/16/2012 [facility] Patient Information 2015 [place], [place]. This information is not intended to replace advice given to you by your health care provider. Make sure you discuss any questions you have with your health care provider.

## 2014-07-10 NOTE — ED Notes (Signed)
Spoke with pt, her sister and niece re: role of CSW/dcp.  Pt from [place] at [facility] and prefers to go back there at d/c.  She refuses ALF placement, but was agreeable to [facility].  RNCM consulted to arrange HHC/provide custodial care list.

## 2014-07-10 NOTE — ED Notes (Addendum)
Son reports earlier neurological deficits were noticed around lunch time.

## 2014-07-10 NOTE — ED Provider Notes (Signed)
CSN: [id number]     Arrival date & time 07/10/14  5638 History   First MD Initiated Contact with Patient 07/10/14 [phone]     Chief Complaint  Patient presents with  . Fall     (Consider location/radiation/quality/duration/timing/severity/associated sxs/prior Treatment) HPI The patient reports she got up to go to the bathroom. She has been on the toilet and then she was bending forward to pick something up. She reports she just kept going and ended up falling forward onto her head. She denies that she got knocked out. She denies that she has any headache. She does have a large bruise to her forehead which she reports is sore. She denies neck pain, thoracic or abdominal pain, extremity pain. She reports she was able to get herself up and her ability to ambulate was as per normal. She denies being on any blood thinners. Past Medical History  Diagnosis Date  . Glaucoma   . Osteoporosis   . Herpes zoster infection   . MDS (myelodysplastic syndrome)     B-cell lymphoproliferative disorder   History reviewed. No pertinent past surgical history. History reviewed. No pertinent family history. History  Substance Use Topics  . Smoking status: Never Smoker   . Smokeless tobacco: Not on file  . Alcohol Use: Not on file   OB History    No data available     Review of Systems  10 Systems reviewed and are negative for acute change except as noted in the HPI.   Allergies  Codeine  Home Medications   Prior to Admission medications   Medication Sig Start Date End Date Taking? Authorizing Provider  brinzolamide (AZOPT) 1 % ophthalmic suspension Place 1 drop into both eyes 2 (two) times daily.     Yes Historical Provider, MD  Calcium Carbonate-Vit D-Min (CALTRATE PLUS PO) Take 1 tablet by mouth daily.     Yes Historical Provider, MD  dorzolamide-timolol (COSOPT) 22.3-6.8 MG/ML ophthalmic solution Place 1 drop into both eyes 2 (two) times daily.  08/27/12  Yes Historical Provider, MD   latanoprost (XALATAN) 0.005 % ophthalmic solution Place 1 drop into both eyes daily.  08/20/12  Yes Historical Provider, MD   BP 134/45 mmHg  Pulse 65  Temp(Src) 98.3 F (36.8 C) (Oral)  Resp 21  SpO2 93% Physical Exam  Constitutional: She is oriented to person, place, and time. She appears well-developed and well-nourished.  Patient is a thin, well maintained 81 year old female. She is alert and in no distress. GCS is 15 she has no respiratory distress.  HENT:  There is a 4 cm hematoma to the left forehead. There is a very superficial abrasion over it. The remainder of the head is atraumatic. No additional facial injury. No periorbital hematoma.  Eyes: Conjunctivae and EOM are normal. Pupils are equal, round, and reactive to light.  Neck: Neck supple.  No cervical spine tenderness to palpation.  Cardiovascular: Normal rate, regular rhythm, normal heart sounds and intact distal pulses.   Pulmonary/Chest: Effort normal and breath sounds normal.  Chest wall is nontender to compression with no contusions or abrasions.  Abdominal: Soft. Bowel sounds are normal. She exhibits no distension. There is no tenderness.  Musculoskeletal: Normal range of motion. She exhibits no edema or tenderness.  Bilateral upper extremities are put through range of motion of the elbow and shoulder without pain. Grip strength are good and symmetric bilaterally with no hand or wrist pain. Bilateral lower extremities are put through range of motion at the hip and  knee without pain or difficulty. Extension and flexion at the ankles is normal without pedal pain or ankle pain.  Neurological: She is alert and oriented to person, place, and time. She has normal strength. No cranial nerve deficit. She exhibits normal muscle tone. Coordination normal. GCS eye subscore is 4. GCS verbal subscore is 5. GCS motor subscore is 6.  Skin: Skin is warm, dry and intact.  Psychiatric: She has a normal mood and affect.    ED Course   Procedures (including critical care time) Labs Review Labs Reviewed - No data to display  Imaging Review Ct Head Wo Contrast  07/10/2014   CLINICAL DATA:  Nausea and vomiting following fall 1 day prior  EXAM: CT HEAD WITHOUT CONTRAST  TECHNIQUE: Contiguous axial images were obtained from the base of the skull through the vertex without intravenous contrast.  COMPARISON:  None.  FINDINGS: There is moderate diffuse atrophy. There is no intracranial mass, hemorrhage, extra-axial fluid collection, or midline shift. There is small vessel disease throughout the centra semiovale bilaterally. Small vessel disease is also noted in the internal and external capsules bilaterally. No acute infarct apparent.  There is a left frontal scalp hematoma. Bones are osteoporotic. No fractures are seen. There is a small exostosis adjacent to the mastoid air cells on the right laterally. Mastoid air cells are clear. Cataracts are noted bilaterally.  IMPRESSION: Moderate atrophy with small vessel disease throughout the periventricular white matter as well as in both internal and external capsules. No acute infarct apparent. No intracranial mass, hemorrhage, or extra-axial fluid collection. Left frontal scalp hematoma. Bones osteoporotic.   Electronically Signed   By: Lowella Grip III M.D.   On: 07/10/2014 08:32     EKG Interpretation None      MDM   Final diagnoses:  Fall, initial encounter  Traumatic hematoma of forehead, initial encounter   Patient has clear mental status. There are no other identified areas of injury. She'll be returned to her facility with head injury instructions.   Charlesetta Shanks, MD 07/10/14 [phone]

## 2014-07-10 NOTE — Progress Notes (Signed)
Received report from ED. Zamir Staples Thacker, RN 

## 2014-07-11 DIAGNOSIS — W19XXXA Unspecified fall, initial encounter: Secondary | ICD-10-CM | POA: Diagnosis not present

## 2014-07-11 DIAGNOSIS — S0003XA Contusion of scalp, initial encounter: Secondary | ICD-10-CM | POA: Diagnosis not present

## 2014-07-11 DIAGNOSIS — R2681 Unsteadiness on feet: Secondary | ICD-10-CM | POA: Diagnosis not present

## 2014-07-11 DIAGNOSIS — S0990XA Unspecified injury of head, initial encounter: Secondary | ICD-10-CM | POA: Diagnosis not present

## 2014-07-11 DIAGNOSIS — S0083XA Contusion of other part of head, initial encounter: Secondary | ICD-10-CM | POA: Diagnosis not present

## 2014-07-11 DIAGNOSIS — D469 Myelodysplastic syndrome, unspecified: Secondary | ICD-10-CM | POA: Diagnosis not present

## 2014-07-11 LAB — CBC
HCT: 35.2 % — ABNORMAL LOW (ref 36.0–46.0)
Hemoglobin: 11.6 g/dL — ABNORMAL LOW (ref 12.0–15.0)
MCH: 35.9 pg — ABNORMAL HIGH (ref 26.0–34.0)
MCHC: 33 g/dL (ref 30.0–36.0)
MCV: 109 fL — ABNORMAL HIGH (ref 78.0–100.0)
Platelets: 85 10*3/uL — ABNORMAL LOW (ref 150–400)
RBC: 3.23 MIL/uL — AB (ref 3.87–5.11)
RDW: 13.2 % (ref 11.5–15.5)
WBC: 4.1 10*3/uL (ref 4.0–10.5)

## 2014-07-11 LAB — URINALYSIS, ROUTINE W REFLEX MICROSCOPIC
Bilirubin Urine: NEGATIVE
GLUCOSE, UA: NEGATIVE mg/dL
Hgb urine dipstick: NEGATIVE
Ketones, ur: NEGATIVE mg/dL
LEUKOCYTES UA: NEGATIVE
Nitrite: NEGATIVE
Protein, ur: NEGATIVE mg/dL
Specific Gravity, Urine: 1.021 (ref 1.005–1.030)
Urobilinogen, UA: 1 mg/dL (ref 0.0–1.0)
pH: 6 (ref 5.0–8.0)

## 2014-07-11 LAB — BASIC METABOLIC PANEL
ANION GAP: 7 (ref 5–15)
BUN: 22 mg/dL (ref 6–23)
CO2: 26 mmol/L (ref 19–32)
Calcium: 7.9 mg/dL — ABNORMAL LOW (ref 8.4–10.5)
Chloride: 105 mmol/L (ref 96–112)
Creatinine, Ser: 0.72 mg/dL (ref 0.50–1.10)
GFR calc non Af Amer: 73 mL/min — ABNORMAL LOW (ref >90)
GFR, EST AFRICAN AMERICAN: 85 mL/min — AB (ref >90)
Glucose, Bld: 95 mg/dL (ref 70–99)
Potassium: 3.7 mmol/L (ref 3.5–5.1)
Sodium: 138 mmol/L (ref 135–145)

## 2014-07-11 MED ORDER — SODIUM CHLORIDE 0.9 % IJ SOLN
3.0000 mL | Freq: Two times a day (BID) | INTRAMUSCULAR | Status: DC
  Administered 2014-07-11 (×2): 3 mL via INTRAVENOUS

## 2014-07-11 MED ORDER — SODIUM CHLORIDE 0.9 % IV SOLN: 250.0000 mL | INTRAVENOUS | Status: DC | PRN

## 2014-07-11 MED ORDER — ONDANSETRON HCL 4 MG PO TABS: 4.0000 mg | ORAL_TABLET | Freq: Four times a day (QID) | ORAL | Status: DC | PRN

## 2014-07-11 MED ORDER — LATANOPROST 0.005 % OP SOLN
1.0000 [drp] | Freq: Every day | OPHTHALMIC | Status: DC
Start: 2014-07-11 — End: 2014-07-11
  Administered 2014-07-11: 1 [drp] via OPHTHALMIC
  Filled 2014-07-11: qty 2.5

## 2014-07-11 MED ORDER — HYDROMORPHONE HCL 1 MG/ML IJ SOLN: 0.5000 mg | INTRAMUSCULAR | Status: DC | PRN

## 2014-07-11 MED ORDER — SODIUM CHLORIDE 0.9 % IJ SOLN
3.0000 mL | INTRAMUSCULAR | Status: DC | PRN
Start: 2014-07-11 — End: 2014-07-11

## 2014-07-11 MED ORDER — ONDANSETRON HCL 4 MG/2ML IJ SOLN: 4.0000 mg | Freq: Four times a day (QID) | INTRAMUSCULAR | Status: DC | PRN

## 2014-07-11 MED ORDER — LACTATED RINGERS IV BOLUS (SEPSIS)
1000.0000 mL | Freq: Once | INTRAVENOUS | Status: AC
  Administered 2014-07-11: 1000 mL via INTRAVENOUS

## 2014-07-11 MED ORDER — CALCIUM CARBONATE 1250 (500 CA) MG PO TABS
500.0000 mg | ORAL_TABLET | Freq: Two times a day (BID) | ORAL | Status: DC
  Administered 2014-07-11: 500 mg via ORAL
  Filled 2014-07-11 (×3): qty 1

## 2014-07-11 MED ORDER — ACETAMINOPHEN 325 MG PO TABS: 650.0000 mg | ORAL_TABLET | Freq: Four times a day (QID) | ORAL | Status: DC | PRN

## 2014-07-11 MED ORDER — ALUM & MAG HYDROXIDE-SIMETH 200-200-20 MG/5ML PO SUSP: 30.0000 mL | Freq: Four times a day (QID) | ORAL | Status: DC | PRN

## 2014-07-11 MED ORDER — DORZOLAMIDE HCL-TIMOLOL MAL 2-0.5 % OP SOLN
1.0000 [drp] | Freq: Two times a day (BID) | OPHTHALMIC | Status: DC
Start: 2014-07-11 — End: 2014-07-11
  Administered 2014-07-11: 1 [drp] via OPHTHALMIC
  Filled 2014-07-11: qty 10

## 2014-07-11 MED ORDER — ACETAMINOPHEN 650 MG RE SUPP: 650.0000 mg | Freq: Four times a day (QID) | RECTAL | Status: DC | PRN

## 2014-07-11 MED ORDER — OXYCODONE HCL 5 MG PO TABS: 5.0000 mg | ORAL_TABLET | ORAL | Status: DC | PRN

## 2014-07-11 NOTE — Discharge Summary (Signed)
Discharge Summary  Misty Dunn DUK:[id number] DOB: June 13, 1921  PCP: Mathews Argyle, MD  Admit date: 07/10/2014 Discharge date: 07/11/2014  Time spent: >87mins  Recommendations for Outpatient Follow-up:  1. F/u with PMD within a week, to continue monitor oral intake 2. Home health/PT/walker/fall precaution  Discharge Diagnoses:  Active Hospital Problems   Diagnosis Date Noted  . Unsteady gait 07/10/2014  . Hematoma of frontal scalp 07/10/2014  . Fall 07/10/2014  . MDS (myelodysplastic syndrome) 07/10/2014  . Difficulty walking 07/10/2014    Resolved Hospital Problems   Diagnosis Date Noted Date Resolved  No resolved problems to display.    Discharge Condition: stable  Diet recommendation: regular diet, encourage fluids intake, nutrition supplement per patient's preference. Communicated with patient's daughter.  Filed Weights   07/11/14 0012  Weight: 44.453 kg (98 lb)    History of present illness:  Misty Dunn is a 81 y.o. female with history of Myelodysplastic Syndrome, stable under surveillance by hematology here in [place], h/o Osteoporosis, Glaucoma who presents to the ED with complaints of increased weakness especially in her legs and difficulty walking. She suffered a fall 1 day ago and was seen in the ED at [facility] long and evaluated and found to have a Left Frontal scalp hematoma, but in the afternoon, she was sent back to the ED due to increased weakness. She currently lives in the [place] section of [facility], and Case Management has been consulted for assistance with placing her in Assisted Living or SNF.  Hospital Course:  Active Problems:   Unsteady gait   Hematoma of frontal scalp   Fall   MDS (myelodysplastic syndrome)   Difficulty walking  FTT/fall: found to have orthostatic hypotension, corrected after one liter of fluids. UA concentrated but No sign of infection. Discussed with daughter to encourage oral  intake/nutrition. Patient declined SNF placement, home health/PT/Walker provided.  MDS, counts stable, continue outpatient hematology follow up.  Mild dementia: AAOx3, daughter reported that she noticed patient started to have  some memory impairment and word finding problems for about a year, currently at baseline. CThead/MRIbrain no acute findings.  Hematoma of frontal scalp: stable. No active bleed.  Procedures:  none  Consultations:  none  Discharge Exam: BP 134/51 mmHg  Pulse 72  Temp(Src) 97.7 F (36.5 C) (Oral)  Resp 16  Ht 5\' 3"  (1.6 m)  Wt 44.453 kg (98 lb)  BMI 17.36 kg/m2  SpO2 99%  General: AAOx3, NAD, left forehead abrasion/scalp hematoma, no active bleed. Cardiovascular: RRR Respiratory: CTABL Ab: soft/NT/ND, positive bowel sounds Extremity: no edema Skin: no rash Neuro: AAoX3, mild word finding problem and mild memory impairment. No focal deficit appreciated.  Discharge Instructions You were cared for by a hospitalist during your hospital stay. If you have any questions about your discharge medications or the care you received while you were in the hospital after you are discharged, you can call the unit and asked to speak with the hospitalist on call if the hospitalist that took care of you is not available. Once you are discharged, your primary care physician will handle any further medical issues. Please note that NO REFILLS for any discharge medications will be authorized once you are discharged, as it is imperative that you return to your primary care physician (or establish a relationship with a primary care physician if you do not have one) for your aftercare needs so that they can reassess your need for medications and monitor your lab values.  Discharge Instructions  Diet - low sodium heart healthy    Complete by:  As directed   Encourage fluids intake, Nutrition supplement per patient's preferences     Face-to-face encounter (required for  Medicare/Medicaid patients)    Complete by:  As directed   I Cheynne Virden certify that this patient is under my care and that I, or a nurse practitioner or physician's assistant working with me, had a face-to-face encounter that meets the physician face-to-face encounter requirements with this patient on 07/11/2014. The encounter with the patient was in whole, or in part for the following medical condition(s) which is the primary reason for home health care (List medical condition): FTT  The encounter with the patient was in whole, or in part, for the following medical condition, which is the primary reason for home health care:  FTT/falls  I certify that, based on my findings, the following services are medically necessary home health services:   Nursing Physical therapy    Reason for Medically Necessary Home Health Services:  Skilled Nursing- Change/Decline in Patient Status  My clinical findings support the need for the above services:  Unable to leave home safely without assistance and/or assistive device  Further, I certify that my clinical findings support that this patient is homebound due to:  Unsafe ambulation due to balance issues     Home Health    Complete by:  As directed   To provide the following care/treatments:   PT RN       Increase activity slowly    Complete by:  As directed   Walk with a walker, continue physical therapy, fall precaution.            Medication List    TAKE these medications        CALTRATE PLUS PO  Take 1 tablet by mouth daily.     dorzolamide-timolol 22.3-6.8 MG/ML ophthalmic solution  Commonly known as:  COSOPT  Place 1 drop into both eyes 2 (two) times daily.     latanoprost 0.005 % ophthalmic solution  Commonly known as:  XALATAN  Place 1 drop into both eyes daily.       Allergies  Allergen Reactions  . Codeine Other (See Comments)    hallucinations       Follow-up Information    Follow up with [facility].   Why:   [facility]   Contact information:   [address] [place] [address] [phone]       Follow up with [facility].   Why:  rolling walker  and 3 n 1   Contact information:   [place] [address] [phone]       [facility] up with [facility] [place].   Why:  physical therapy      Follow up with Mathews Argyle, MD In 1 week.   Specialty:  Internal Medicine   Why:  continue monitor nutrition intake   Contact information:   [address] [place]  [address] [phone]        The results of significant diagnostics from this hospitalization (including imaging, microbiology, ancillary and laboratory) are listed below for reference.    Significant Diagnostic Studies: Ct Head Wo Contrast  07/10/2014   CLINICAL DATA:  Nausea and vomiting following fall 1 day prior  EXAM: CT HEAD WITHOUT CONTRAST  TECHNIQUE: Contiguous axial images were obtained from the base of the skull through the vertex without intravenous contrast.  COMPARISON:  None.  FINDINGS: There is moderate diffuse atrophy. There is no intracranial mass, hemorrhage, extra-axial fluid collection, or midline shift. There is small vessel disease throughout the centra semiovale bilaterally. Small vessel disease is also noted in the internal and external capsules bilaterally. No acute infarct apparent.  There is a left frontal scalp hematoma. Bones are osteoporotic. No fractures are seen. There is a small exostosis adjacent to the mastoid air cells on the right laterally. Mastoid air cells are clear. Cataracts are noted bilaterally.  IMPRESSION: Moderate atrophy with small vessel disease throughout the periventricular white matter as well as in both internal and external capsules. No acute infarct apparent. No intracranial mass, hemorrhage, or extra-axial fluid collection. Left frontal scalp hematoma. Bones osteoporotic.   Electronically Signed   By: Lowella Grip III M.D.   On: 07/10/2014 08:32   Mr Brain Wo Contrast  07/10/2014   CLINICAL DATA:  Slurred speech and gait difficulty. Recent fall. Syncopal episode with loss of consciousness.  EXAM: MRI HEAD WITHOUT CONTRAST  TECHNIQUE: Multiplanar, multiecho pulse sequences of the brain and surrounding structures were obtained without intravenous contrast.  COMPARISON:  CT head earlier today.  FINDINGS: No evidence for acute infarction, hemorrhage, mass lesion, hydrocephalus, or extra-axial fluid. Cerebral and cerebellar atrophy of a moderately advanced degree, not unexpected for age. Extensive T2 and FLAIR hyperintensity throughout periventricular and subcortical white matter representing chronic microvascular ischemic change.  Gradient sequence is abnormal, demonstrating numerous subcentimeter areas of susceptibility throughout the cerebral cortex, as well as subcortical and periventricular white matter. No T1 shortening associated with any lesion. No increased FLAIR signal in the subarachnoid space. No increased attenuation on CT to suggest acute shearing injury. The findings are most consistent with sequelae of chronic hypertensive cerebral vascular disease. Cerebral amyloid angiopathy is less favored.  Flow voids are maintained. BILATERAL cataract extraction. No sinus air-fluid level. Negative mastoids. Extracranial soft tissues unremarkable including the upper cervical region.  IMPRESSION: No acute intracranial findings. No stroke or visible extra-axial collection.  Atrophy and small vessel disease.  Widespread areas of microhemorrhage throughout the cerebral hemispheres, likely sequelae of hypertensive cerebrovascular disease.   Electronically Signed   By: Rolla Flatten M.D.   On: 07/10/2014 20:19   Dg Shoulder Left  07/10/2014   CLINICAL DATA:  Unwitnessed fall.  Hematoma and skin tear  EXAM: LEFT SHOULDER - 2+ VIEW  COMPARISON:  None.  FINDINGS: Glenohumeral joint is intact. No evidence of scapular  fracture or humeral fracture. The acromioclavicular joint is intact.  IMPRESSION: No fracture or dislocation.   Electronically Signed   By: Suzy Bouchard M.D.   On: 07/10/2014 18:58    Microbiology: No results found for this or any previous visit (from the past 240 hour(s)).   Labs: Basic Metabolic Panel:  Recent Labs Lab 07/10/14 1808 07/11/14 0450  NA 140 138  K 3.7 3.7  CL 105 105  CO2 27 26  GLUCOSE 116* 95  BUN 25* 22  CREATININE 0.81 0.72  CALCIUM 8.5 7.9*   Liver Function Tests: No results for input(s): AST, ALT, ALKPHOS, BILITOT, PROT, ALBUMIN in the last 168 hours. No results for input(s): LIPASE, AMYLASE in the last 168 hours. No results for input(s): AMMONIA in the last 168 hours. CBC:  Recent Labs Lab 07/10/14 1808 07/11/14 0450  WBC 5.6 4.1  NEUTROABS 5.0  --   HGB 12.0 11.6*  HCT 35.6* 35.2*  MCV 107.6* 109.0*  PLT 90* 85*   Cardiac Enzymes:  No results for input(s): CKTOTAL, CKMB, CKMBINDEX, TROPONINI in the last 168 hours. BNP: BNP (last 3 results) No results for input(s): BNP in the last 8760 hours.  ProBNP (last 3 results) No results for input(s): PROBNP in the last 8760 hours.  CBG: No results for input(s): GLUCAP in the last 168 hours.     SignedFlorencia Reasons MD, PhD  [facility] 07/11/2014, 5:37 PM

## 2014-07-11 NOTE — Care Management Note (Addendum)
    Page 1 of 2   07/11/2014     3:31:17 PM CARE MANAGEMENT NOTE 07/11/2014  Patient:  Misty Dunn, Misty Dunn   Account Number:  [id number]  Date Initiated:  07/11/2014  Documentation initiated by:  Tomi Bamberger  Subjective/Objective Assessment:   dx unsteady gait  admit - from [place].     Action/Plan:   pt eval rec snf- per CSW note daughter wants patient to go back to [facility] with hh.   Anticipated DC Date:  07/11/2014   Anticipated DC Plan:  [place]  CM consult      [facility] Choice  HOME HEALTH   Choice offered to / List presented to:  C-4 Adult Children   DME arranged  [place]      DME agency  [place] arranged  [place] RN      [place].   Status of service:  Completed, signed off Medicare Important Message given?  NA - LOS <3 / Initial given by admissions (If response is "NO", the following Medicare IM given date fields will be blank) Date Medicare IM given:   Medicare IM given by:   Date Additional Medicare IM given:   Additional Medicare IM given by:    Discharge Disposition:  [place]  Per UR Regulation:  Reviewed for med. necessity/level of care/duration of stay  If discussed at [place] of Stay Meetings, dates discussed:    Comments:  07/11/14 [place] BSN [phone] patient may be dc back to [facility] today, NCM spoke with daughter she chose [facility] and DME rolling walker and 3 n 1.  Referral made to [place] and Jeneen Rinks (for DME) he will bring up to room.  NCM spoke with [facility] they would just need the order for physical therapy for [facility] onsite to facilitate for patient.  NCM also gave daughter a private duty list.  NCM informed Adrianne that she will need to give daughter the physical therapy order for [facility] when patient leaves. Informed MD that she will need the orders  for [facility]  and HHPT.

## 2014-07-11 NOTE — Evaluation (Signed)
Physical Therapy Evaluation Patient Details Name: Misty Dunn MRN: [id number] DOB: 06/26/1921 Today's Date: 07/11/2014   History of Present Illness  Misty Dunn is a 81 y.o. female with history of Myelodysplastic Syndrome, Osteoporosis, Glaucoma who presents to the ED with complaints of increased weakness especially in her legs and difficulty walker. She suffered a fall 1 day ago and was seen in the ED at [facility] long and evaluated and found to have a Left Frontal scalp hematoma, but in the afternoon, she was sent back to the ED due to increased weakness. She currently lives in the [place] section of [place].  Clinical Impression  Pt admitted with the above complications. Pt currently with functional limitations due to the deficits listed below (see PT Problem List). Requires physical assistance to ambulate safely and demonstrates a very guarded gait pattern with intermittent bouts of loss of balance. Unable to recall month/year at end of therapy session after patient was oriented at beginning of session. Pt will benefit from skilled PT to increase their independence and safety with mobility to allow discharge to the venue listed below.      Follow Up Recommendations Supervision/Assistance - 24 hour;SNF    Equipment Recommendations  None recommended by PT    Recommendations for Other Services OT consult     Precautions / Restrictions Precautions Precautions: Fall Restrictions Weight Bearing Restrictions: No      Mobility  Bed Mobility Overal bed mobility: Modified Independent             General bed mobility comments: requires extra time  Transfers Overall transfer level: Needs assistance Equipment used: Rolling walker (2 wheeled) Transfers: Sit to/from Stand Sit to Stand: Min assist         General transfer comment: VC for hand placement. Leans heavily to posterior using back of knees for support on bed. Min assist for  balance.  Ambulation/Gait Ambulation/Gait assistance: Min assist Ambulation Distance (Feet): 65 Feet Assistive device: Rolling walker (2 wheeled);1 person hand held assist Gait Pattern/deviations: Step-to pattern;Step-through pattern;Decreased stride length;Shuffle;Drifts right/left;Narrow base of support Gait velocity: very slow   General Gait Details: Very slow and guarded with minimal rotation of hips during gait. Drifting to Rt with use of rolling walker, min assist for placement. Hand held assist for majority of bout with intermittent bouts of loss of balance requring assistance to prevent falls. Able to turn head horziontally and vertically with notable decrease in gait speed. Difficulty ambulating when trying to perform cognitive tasks.  Stairs            Wheelchair Mobility    Modified Rankin (Stroke Patients Only)       Balance Overall balance assessment: Needs assistance;History of Falls Sitting-balance support: No upper extremity supported;Feet supported Sitting balance-Leahy Scale: Fair     Standing balance support: Single extremity supported Standing balance-Leahy Scale: Poor                               Pertinent Vitals/Pain Pain Assessment: 0-10 Pain Score: 7  Pain Location: Left shoulder Pain Descriptors / Indicators: Aching;Constant Pain Intervention(s): Monitored during session;Repositioned    Home Living Family/patient expects to be discharged to:: Other (Comment) (Independent living) Living Arrangements: Alone Available Help at Discharge: Other (Comment) (Independent living - call bells in facility) Type of Home: Independent living facility Home Access: Level entry     Home Layout: One level Home Equipment: Cane - single point;Shower seat -  built in;Grab bars - tub/shower;Grab bars - toilet      Prior Function Level of Independence: Independent with assistive device(s)         Comments: Uses cane for mobility     Hand  Dominance   Dominant Hand: Right    Extremity/Trunk Assessment   Upper Extremity Assessment: Defer to OT evaluation           Lower Extremity Assessment: Generalized weakness         Communication   Communication: No difficulties  Cognition Arousal/Alertness: Awake/alert Behavior During Therapy: WFL for tasks assessed/performed Overall Cognitive Status: Impaired/Different from baseline Area of Impairment: Orientation Orientation Level: Disoriented to;Time (delayed responses- unsure of year.)   Memory: Decreased short-term memory         General Comments: Aware that she is in the hospital but unsure of the name, unable to recall month/year at end of therapy session after re-orienting at beginning. Slow to respond to questions.    General Comments      Exercises General Exercises - Lower Extremity Ankle Circles/Pumps: AROM;Both;10 reps;Seated      Assessment/Plan    PT Assessment Patient needs continued PT services  PT Diagnosis Difficulty walking;Abnormality of gait;Generalized weakness;Acute pain   PT Problem List Decreased strength;Decreased balance;Decreased activity tolerance;Decreased range of motion;Decreased mobility;Decreased coordination;Decreased cognition;Decreased knowledge of use of DME;Decreased safety awareness;Decreased knowledge of precautions;Pain  PT Treatment Interventions DME instruction;Gait training;Functional mobility training;Therapeutic activities;Therapeutic exercise;Balance training;Neuromuscular re-education;Patient/family education;Cognitive remediation;Modalities   PT Goals (Current goals can be found in the Care Plan section) Acute Rehab PT Goals Patient Stated Goal: Go home PT Goal Formulation: With patient Time For Goal Achievement: 07/25/14 Potential to Achieve Goals: Good    Frequency Min 3X/week   Barriers to discharge Decreased caregiver support lives alone    Co-evaluation               End of Session Equipment  Utilized During Treatment: Gait belt Activity Tolerance: Patient tolerated treatment well Patient left: in chair;with call bell/phone within reach;with chair alarm set;with SCD's reapplied Nurse Communication: Mobility status;Precautions         Time: 3500-9381 PT Time Calculation (min) (ACUTE ONLY): 32 min   Charges:   PT Evaluation $Initial PT Evaluation Tier I: 1 Procedure PT Treatments $Gait Training: 8-22 mins   PT G CodesEllouise Dunn 07/11/2014, 10:17 AM Elayne Snare, [place]

## 2014-07-11 NOTE — Progress Notes (Signed)
Pt admitted to 5w30 from ED. Pt is A&Ox2. Pt has abrasion to left forehead and left side of head/face is bruised. Pt has bruised area to left elbow, abrasion to left leg, and sacrum is red but blanchable. Pt oriented to room. Pt instructed on how to call nurse. Pt placed on high fall precautions and placed on bed alarm. Will continue to monitor pt. Ranelle Oyster, RN

## 2014-07-11 NOTE — Progress Notes (Signed)
Patient discharge teaching given, including activity, diet, follow-up appoints, and medications. Patient verbalized understanding of all discharge instructions. IV access was d/c'd. Vitals are stable. Skin is intact except as charted in most recent assessments. Pt to be escorted out by NT, to be driven home by family. 

## 2014-07-17 NOTE — Progress Notes (Signed)
Physical Therapy Note  Late G-Code Entry    July 27, 2014 0900  PT G-Codes **NOT FOR INPATIENT CLASS**  Functional Assessment Tool Used clinical observation  Functional Limitation Mobility: Walking and moving around  Mobility: Walking and Moving Around Current Status [phone]) CI  Mobility: Walking and Moving Around Goal Status [phone]) CI   [name], [place]

## 2014-09-18 ENCOUNTER — Other Ambulatory Visit: Payer: Medicare Other

## 2014-09-18 ENCOUNTER — Ambulatory Visit: Payer: Medicare Other | Admitting: Oncology

## 2016-09-28 IMAGING — MR MR HEAD W/O CM
9 of 10 series · 37 of 48 positions shown · non-contrast
Comparison: CT head earlier today.

CLINICAL DATA: Slurred speech and gait difficulty. Recent fall.
Syncopal episode with loss of consciousness.

EXAM:
MRI HEAD WITHOUT CONTRAST
TECHNIQUE: Multiplanar, multiecho pulse sequences of the brain and surrounding
structures were obtained without intravenous contrast.

[Series 3: DWI · axial · 3.0mm · 1.09mm/px · z∈[-28,+110]mm · 11 of 97 slices shown (1 of 4)]
[im 1/97]
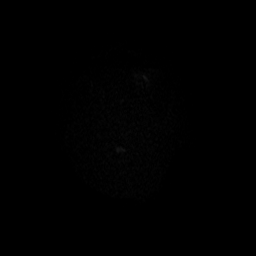
[im 10/97]
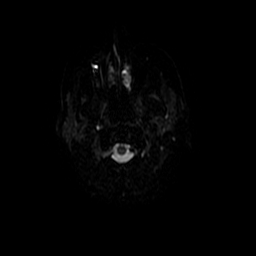
[im 20/97]
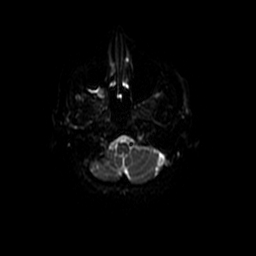
[im 29/97]
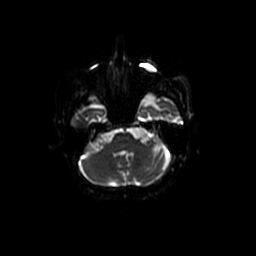
[im 39/97]
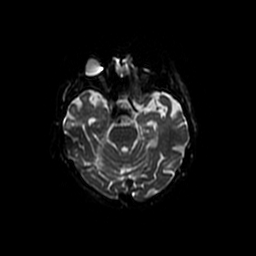
[im 49/97]
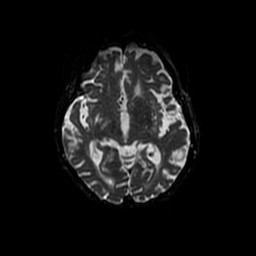
[im 58/97]
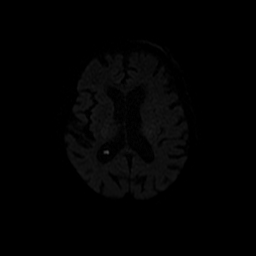
[im 68/97]
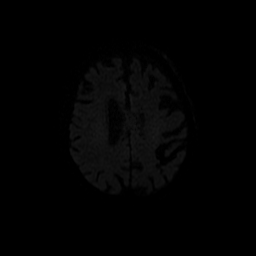
[im 77/97]
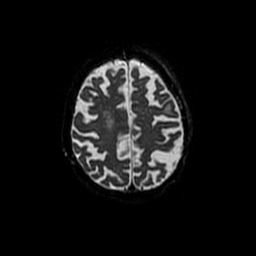
[im 87/97]
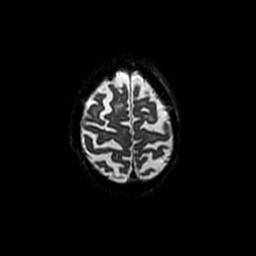
[im 97/97]
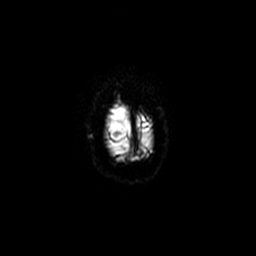

[Series 4: ax mpgr · axial · 5.0mm · 0.43mm/px · z∈[-43,+28]mm · 2 of 25 slices shown]
[im 1/25]
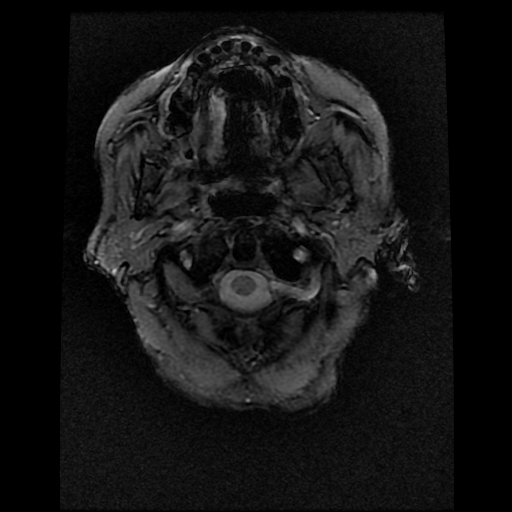
[im 13/25]
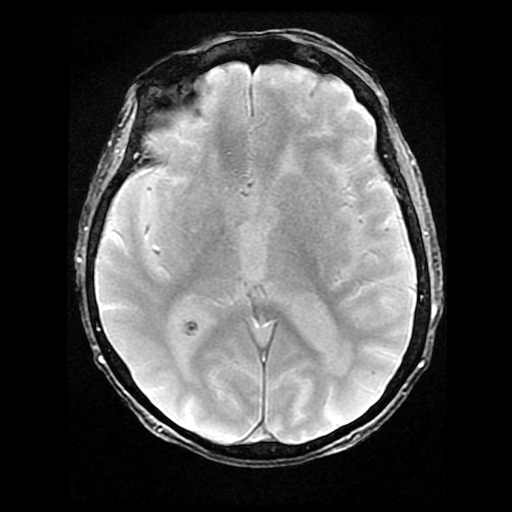

[Series 5: T1 · sagittal · 6.0mm · 0.47mm/px · 2 of 23 slices shown]
[im 1/23]
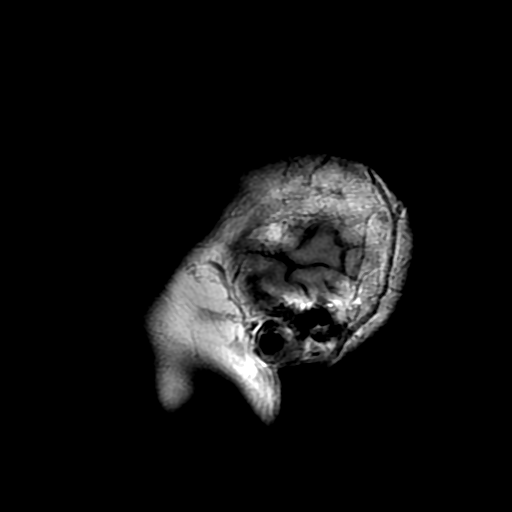
[im 23/23]
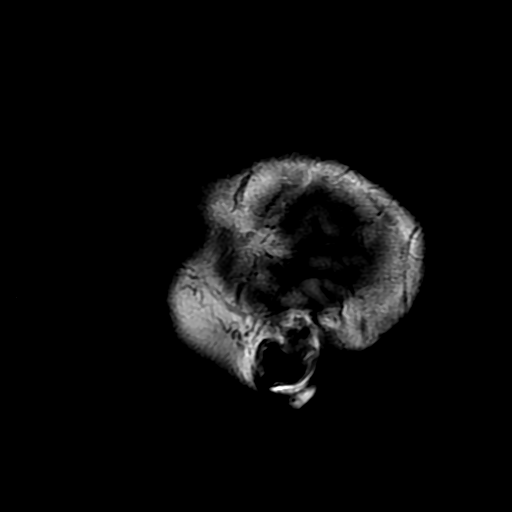

[Series 6: T2 · axial · 5.0mm · 0.43mm/px · z∈[-34,+101]mm · 2 of 24 slices shown (1 of 2)]
[im 1/24]
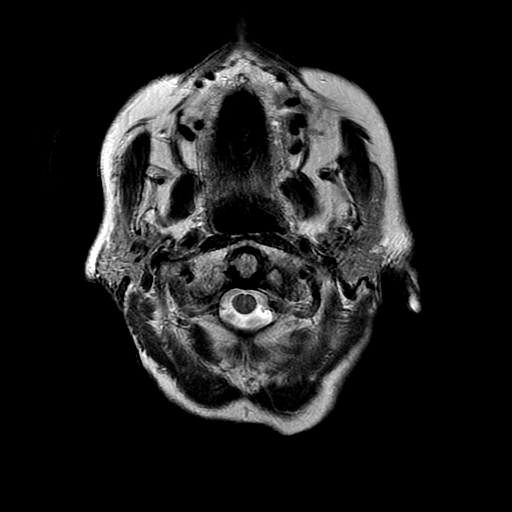
[im 24/24]
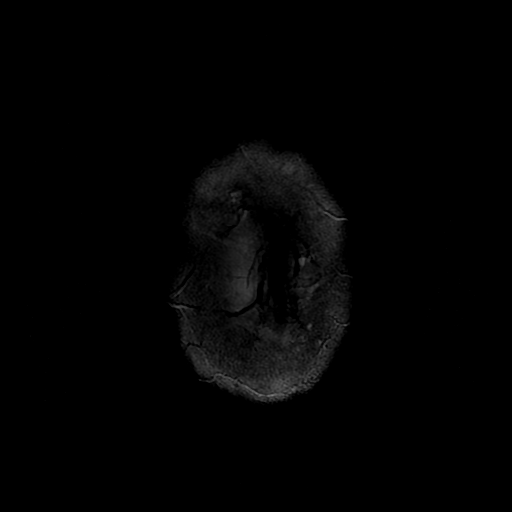

[Series 7: FLAIR · axial · 5.0mm · 0.43mm/px · z∈[-34,+101]mm · 2 of 24 slices shown]
[im 1/24]
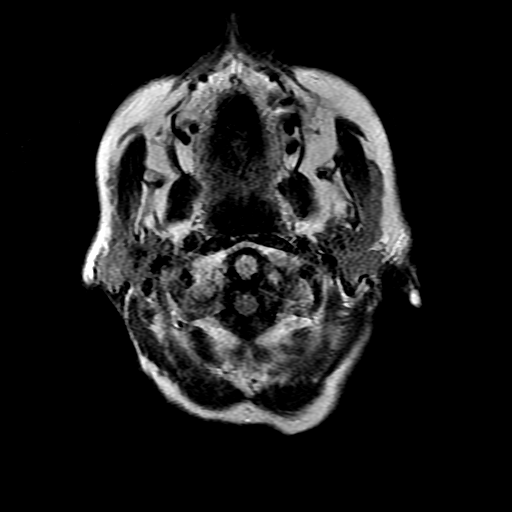
[im 24/24]
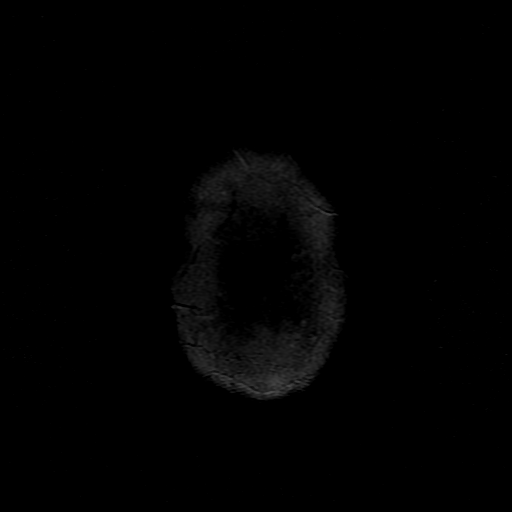

[Series 8: DWI · coronal · 5.0mm · 1.09mm/px · 7 of 66 slices shown (2 of 4)]
[im 1/66]
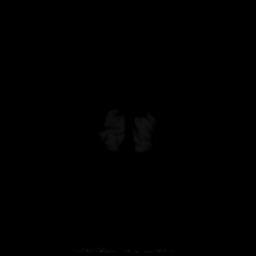
[im 11/66]
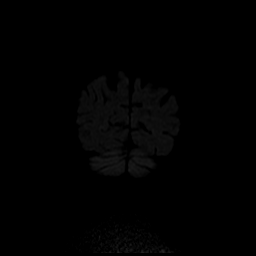
[im 22/66]
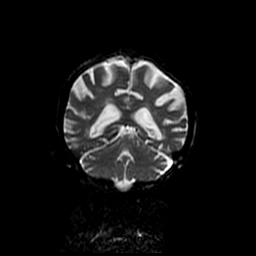
[im 33/66]
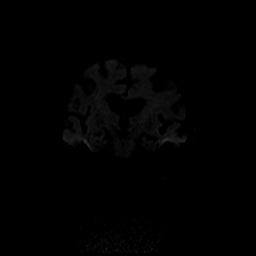
[im 44/66]
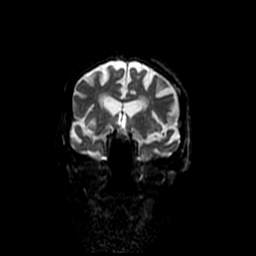
[im 55/66]
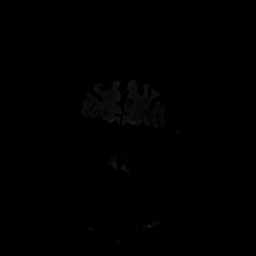
[im 66/66]
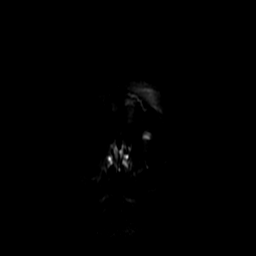

[Series 10: T2 · coronal · 5.0mm · 0.43mm/px · 3 of 27 slices shown (2 of 2)]
[im 1/27]
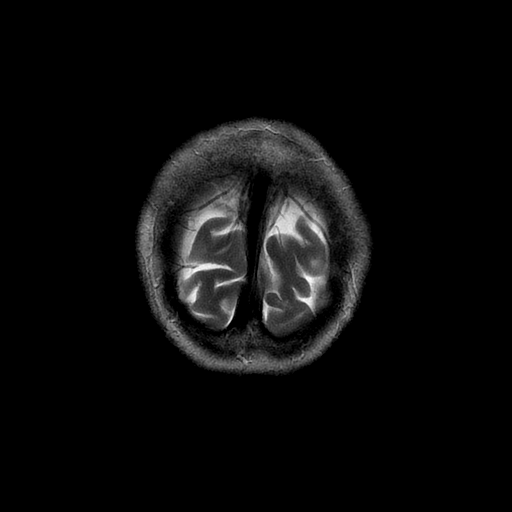
[im 14/27]
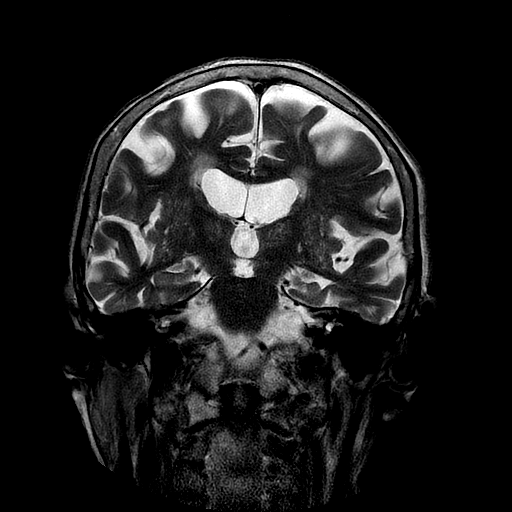
[im 27/27]
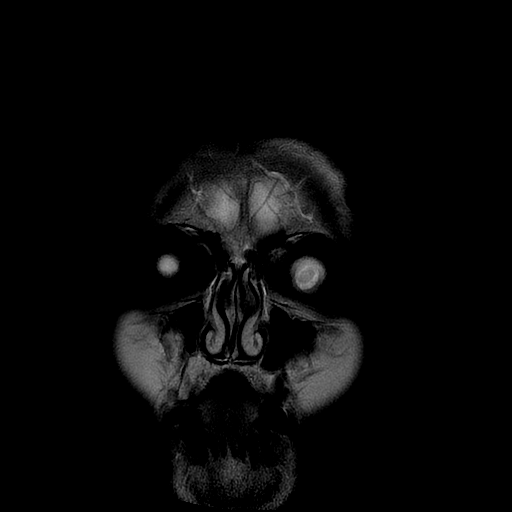

[Series 300: DWI · axial · 3.0mm · 1.09mm/px · z∈[-28,+110]mm · 5 of 49 slices shown (3 of 4)]
[im 1/49]
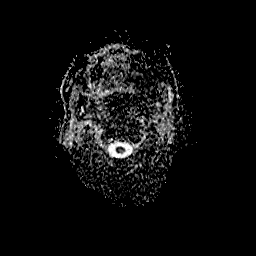
[im 13/49]
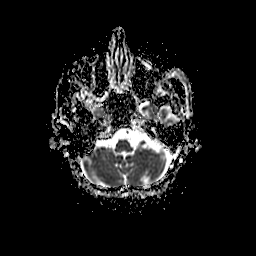
[im 25/49]
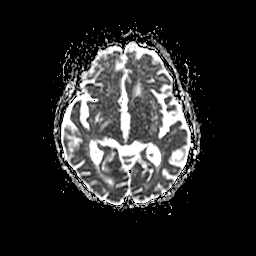
[im 37/49]
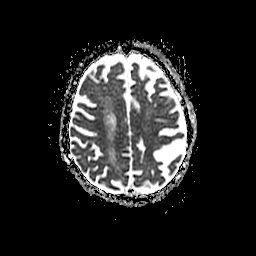
[im 49/49]
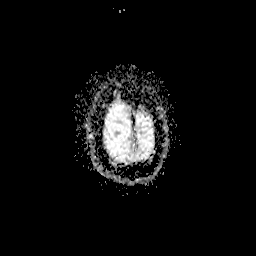

[Series 800: DWI · coronal · 5.0mm · 1.09mm/px · 3 of 33 slices shown (4 of 4)]
[im 1/33]
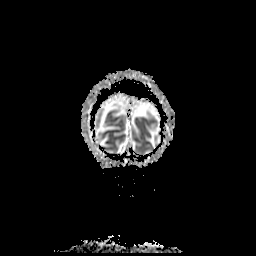
[im 17/33]
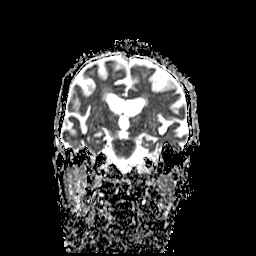
[im 33/33]
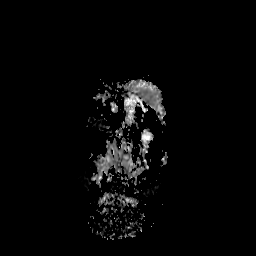

[37 of 48 positions shown; findings below may reference images not displayed]

FINDINGS: No evidence for acute infarction, hemorrhage, mass lesion,
hydrocephalus, or extra-axial fluid. Cerebral and cerebellar atrophy
of a moderately advanced degree, not unexpected for age. Extensive
T2 and FLAIR hyperintensity throughout periventricular and
subcortical white matter representing chronic microvascular ischemic
change.

Gradient sequence is abnormal, demonstrating numerous subcentimeter
areas of susceptibility throughout the cerebral cortex, as well as
subcortical and periventricular white matter. No T1 shortening
associated with any lesion. No increased FLAIR signal in the
subarachnoid space. No increased attenuation on CT to suggest acute
shearing injury. The findings are most consistent with sequelae of
chronic hypertensive cerebral vascular disease. Cerebral amyloid
angiopathy is less favored.

Flow voids are maintained. BILATERAL cataract extraction. No sinus
air-fluid level. Negative mastoids. Extracranial soft tissues
unremarkable including the upper cervical region.
IMPRESSION: No acute intracranial findings. No stroke or visible extra-axial
collection.

Atrophy and small vessel disease.

Widespread areas of microhemorrhage throughout the cerebral
hemispheres, likely sequelae of hypertensive cerebrovascular
disease.

## 2018-08-23 DEATH — deceased
